# Patient Record
Sex: Female | Born: 1950 | Race: Black or African American | Hispanic: No | Marital: Married | Smoking: Never smoker
Health system: Southern US, Community
[De-identification: ages and names within clinical notes are randomized; demographics above are authoritative.]

## PROBLEM LIST (undated history)

## (undated) DIAGNOSIS — B009 Herpesviral infection, unspecified: Secondary | ICD-10-CM

## (undated) DIAGNOSIS — M1990 Unspecified osteoarthritis, unspecified site: Secondary | ICD-10-CM

## (undated) DIAGNOSIS — I10 Essential (primary) hypertension: Secondary | ICD-10-CM

## (undated) DIAGNOSIS — H269 Unspecified cataract: Secondary | ICD-10-CM

## (undated) DIAGNOSIS — G510 Bell's palsy: Secondary | ICD-10-CM

## (undated) DIAGNOSIS — K219 Gastro-esophageal reflux disease without esophagitis: Secondary | ICD-10-CM

## (undated) HISTORY — DX: Essential (primary) hypertension: I10

## (undated) HISTORY — DX: Bell's palsy: G51.0

## (undated) HISTORY — DX: Herpesviral infection, unspecified: B00.9

## (undated) HISTORY — DX: Unspecified osteoarthritis, unspecified site: M19.90

## (undated) HISTORY — DX: Unspecified cataract: H26.9

## (undated) HISTORY — PX: ABDOMINAL HYSTERECTOMY: SHX81

## (undated) HISTORY — PX: POLYPECTOMY: SHX149

## (undated) HISTORY — DX: Gastro-esophageal reflux disease without esophagitis: K21.9

## (undated) HISTORY — PX: COLONOSCOPY: SHX174

---

## 1997-03-11 ENCOUNTER — Ambulatory Visit (HOSPITAL_COMMUNITY): Admission: RE | Admit: 1997-03-11 | Discharge: 1997-03-11 | Payer: Self-pay | Admitting: Obstetrics and Gynecology

## 1998-05-28 ENCOUNTER — Other Ambulatory Visit: Admission: RE | Admit: 1998-05-28 | Discharge: 1998-05-28 | Payer: Self-pay | Admitting: Obstetrics and Gynecology

## 1999-03-27 ENCOUNTER — Encounter: Admission: RE | Admit: 1999-03-27 | Discharge: 1999-03-27 | Payer: Self-pay | Admitting: Obstetrics and Gynecology

## 1999-03-27 ENCOUNTER — Encounter: Payer: Self-pay | Admitting: Obstetrics and Gynecology

## 1999-10-13 ENCOUNTER — Other Ambulatory Visit: Admission: RE | Admit: 1999-10-13 | Discharge: 1999-10-13 | Payer: Self-pay | Admitting: Obstetrics and Gynecology

## 2000-10-05 ENCOUNTER — Encounter: Payer: Self-pay | Admitting: Internal Medicine

## 2000-10-05 ENCOUNTER — Encounter: Admission: RE | Admit: 2000-10-05 | Discharge: 2000-10-05 | Payer: Self-pay | Admitting: Internal Medicine

## 2000-10-17 ENCOUNTER — Other Ambulatory Visit: Admission: RE | Admit: 2000-10-17 | Discharge: 2000-10-17 | Payer: Self-pay | Admitting: Obstetrics and Gynecology

## 2001-10-11 ENCOUNTER — Encounter: Payer: Self-pay | Admitting: Internal Medicine

## 2001-10-11 ENCOUNTER — Encounter: Admission: RE | Admit: 2001-10-11 | Discharge: 2001-10-11 | Payer: Self-pay | Admitting: Internal Medicine

## 2001-11-27 ENCOUNTER — Other Ambulatory Visit: Admission: RE | Admit: 2001-11-27 | Discharge: 2001-11-27 | Payer: Self-pay | Admitting: Obstetrics and Gynecology

## 2002-06-12 ENCOUNTER — Encounter: Admission: RE | Admit: 2002-06-12 | Discharge: 2002-06-12 | Payer: Self-pay | Admitting: Internal Medicine

## 2002-06-12 ENCOUNTER — Encounter: Payer: Self-pay | Admitting: Internal Medicine

## 2002-10-19 ENCOUNTER — Encounter: Admission: RE | Admit: 2002-10-19 | Discharge: 2002-10-19 | Payer: Self-pay | Admitting: Internal Medicine

## 2002-10-19 ENCOUNTER — Encounter: Payer: Self-pay | Admitting: Internal Medicine

## 2002-12-28 ENCOUNTER — Other Ambulatory Visit: Admission: RE | Admit: 2002-12-28 | Discharge: 2002-12-28 | Payer: Self-pay | Admitting: Obstetrics and Gynecology

## 2004-01-27 ENCOUNTER — Other Ambulatory Visit: Admission: RE | Admit: 2004-01-27 | Discharge: 2004-01-27 | Payer: Self-pay | Admitting: Obstetrics and Gynecology

## 2004-02-06 ENCOUNTER — Ambulatory Visit: Payer: Self-pay | Admitting: Gastroenterology

## 2004-02-07 ENCOUNTER — Ambulatory Visit: Payer: Self-pay | Admitting: Gastroenterology

## 2005-03-03 ENCOUNTER — Other Ambulatory Visit: Admission: RE | Admit: 2005-03-03 | Discharge: 2005-03-03 | Payer: Self-pay | Admitting: Obstetrics and Gynecology

## 2007-06-02 ENCOUNTER — Emergency Department (HOSPITAL_COMMUNITY): Admission: EM | Admit: 2007-06-02 | Discharge: 2007-06-02 | Payer: Self-pay | Admitting: Emergency Medicine

## 2007-06-05 ENCOUNTER — Emergency Department (HOSPITAL_COMMUNITY): Admission: EM | Admit: 2007-06-05 | Discharge: 2007-06-05 | Payer: Self-pay | Admitting: Family Medicine

## 2008-08-22 ENCOUNTER — Encounter: Admission: RE | Admit: 2008-08-22 | Discharge: 2008-08-22 | Payer: Self-pay | Admitting: Internal Medicine

## 2011-02-03 ENCOUNTER — Encounter: Payer: Self-pay | Admitting: Gastroenterology

## 2011-11-29 ENCOUNTER — Emergency Department (HOSPITAL_COMMUNITY): Payer: No Typology Code available for payment source

## 2011-11-29 ENCOUNTER — Encounter (HOSPITAL_COMMUNITY): Payer: Self-pay | Admitting: Emergency Medicine

## 2011-11-29 ENCOUNTER — Emergency Department (HOSPITAL_COMMUNITY)
Admission: EM | Admit: 2011-11-29 | Discharge: 2011-11-29 | Disposition: A | Discharge status: Home / Self Care | Payer: No Typology Code available for payment source | Attending: Emergency Medicine | Admitting: Emergency Medicine

## 2011-11-29 DIAGNOSIS — R0789 Other chest pain: Secondary | ICD-10-CM | POA: Insufficient documentation

## 2011-11-29 DIAGNOSIS — R079 Chest pain, unspecified: Secondary | ICD-10-CM

## 2011-11-29 LAB — URINALYSIS, ROUTINE W REFLEX MICROSCOPIC
Hgb urine dipstick: NEGATIVE
Specific Gravity, Urine: 1.014 (ref 1.005–1.030)
Urobilinogen, UA: 0.2 mg/dL (ref 0.0–1.0)

## 2011-11-29 LAB — LIPASE, BLOOD: Lipase: 19 U/L (ref 11–59)

## 2011-11-29 LAB — COMPREHENSIVE METABOLIC PANEL
ALT: 11 U/L (ref 0–35)
AST: 19 U/L (ref 0–37)
Albumin: 3.8 g/dL (ref 3.5–5.2)
Calcium: 9.5 mg/dL (ref 8.4–10.5)
GFR calc Af Amer: >90 mL/min (ref >90)
Glucose, Bld: 94 mg/dL (ref 70–99)
Sodium: 140 mEq/L (ref 135–145)
Total Protein: 7.5 g/dL (ref 6.0–8.3)

## 2011-11-29 LAB — CBC WITH DIFFERENTIAL/PLATELET
Basophils Absolute: 0 10*3/uL (ref 0.0–0.1)
Basophils Relative: 1 % (ref 0–1)
Eosinophils Absolute: 0.1 10*3/uL (ref 0.0–0.7)
Eosinophils Relative: 1 % (ref 0–5)
MCH: 28.2 pg (ref 26.0–34.0)
MCHC: 33.1 g/dL (ref 30.0–36.0)
MCV: 85.3 fL (ref 78.0–100.0)
Platelets: 187 10*3/uL (ref 150–400)
RDW: 12.3 % (ref 11.5–15.5)

## 2011-11-29 LAB — URINE MICROSCOPIC-ADD ON

## 2011-11-29 MED ORDER — ACETAMINOPHEN 500 MG PO TABS
500.0000 mg | ORAL_TABLET | Freq: Four times a day (QID) | ORAL | Status: DC | PRN
Start: 2011-11-29 — End: 2012-11-20

## 2011-11-29 MED ORDER — KETOROLAC TROMETHAMINE 30 MG/ML IJ SOLN
30.0000 mg | Freq: Once | INTRAMUSCULAR | Status: AC
  Administered 2011-11-29: 30 mg via INTRAVENOUS
  Filled 2011-11-29: qty 1

## 2011-11-29 NOTE — ED Notes (Signed)
Pt c/o mid sternal to left sided CP x 3 days intermittently that is worse with palpation and movement; pt sts some SOB

## 2011-11-29 NOTE — ED Provider Notes (Signed)
History     CSN: [id number]  Arrival date & time 11/29/11  7829   First MD Initiated Contact with Patient 11/29/11 [phone]      Chief Complaint  Patient presents with  . Chest Pain    HPI  The patient presents with ongoing chest pain.  She notes that her symptoms have been present for months, worse over the last days.  The pain is substernal and to left of sternum.  The pain is nonradiating.  There are episodes of spontaneous onset and offset, but over the last days she has described pain that is present with bending over.  The pain is also reproducible with palpation.  She denies significant dyspnea.  She expressly denies fevers, chills, cough, lightheadedness, syncope, unilateral weakness or size changes. She has no prior evaluation for this pain, and denies any cardiac history.  She does have a history of heartburn. The patient has continued to work, and denies smoking or any recent travel.  History reviewed. No pertinent past medical history.  History reviewed. No pertinent past surgical history.  History reviewed. No pertinent family history.  History  Substance Use Topics  . Smoking status: Never Smoker   . Smokeless tobacco: Not on file  . Alcohol Use: No    OB History    Grav Para Term Preterm Abortions TAB SAB Ect Mult Living                  Review of Systems  Constitutional:       Per HPI, otherwise negative  HENT:       Per HPI, otherwise negative  Eyes: Negative.   Respiratory:       Per HPI, otherwise negative  Cardiovascular:       Per HPI, otherwise negative  Gastrointestinal: Negative for vomiting.  Genitourinary: Negative.   Musculoskeletal:       Per HPI, otherwise negative  Skin: Negative.   Neurological: Negative for syncope.    Allergies  Review of patient's allergies indicates no known allergies.  Home Medications  No current outpatient prescriptions on file.  BP 161/73  Pulse 58  Temp 98 F (36.7 C) (Oral)  Resp 18  SpO2  99%  Physical Exam  Nursing note and vitals reviewed. Constitutional: She is oriented to person, place, and time. She appears well-developed and well-nourished. No distress.  HENT:  Head: Normocephalic and atraumatic.  Eyes: Conjunctivae normal and EOM are normal.  Cardiovascular: Normal rate and regular rhythm.   Pulmonary/Chest: Effort normal and breath sounds normal. No stridor. No respiratory distress.    Abdominal: She exhibits no distension.  Musculoskeletal: She exhibits no edema.  Neurological: She is alert and oriented to person, place, and time. No cranial nerve deficit.  Skin: Skin is warm and dry.  Psychiatric: She has a normal mood and affect.    ED Course  Procedures (including critical care time)   Labs Reviewed  CBC WITH DIFFERENTIAL  COMPREHENSIVE METABOLIC PANEL  TROPONIN I  URINALYSIS, ROUTINE W REFLEX MICROSCOPIC  LIPASE, BLOOD   No results found.   No diagnosis found.  Cardiac: 55sr, borde O2-99%ra, normal   Date: 11/29/2011  Rate: 54  Rhythm: sinus bradycardia  QRS Axis: normal  Intervals: normal  ST/T Wave abnormalities: normal  Conduction Disutrbances:none  Narrative Interpretation:   Old EKG Reviewed: none available BORDERLINE   MDM  The patient presents with months of chest pain, reproducible on exam.  The patient is in no distress with unremarkable vital signs.  Given the patient's description of symptoms for months, the positional exacerbation, the absence of distress, her unremarkable labs and EKG, she was discharged in stable condition after discussion on the need for analgesics, PMD followup.        Gerhard Munch, MD 11/29/11 1244

## 2011-11-29 NOTE — ED Notes (Signed)
Pt reports CP for 3-4 months intermittently.  States that pain is worse with palpation.  Denies N/V/diaphoresis.  States that she was leaned over getting clothes out of the wash when the pain started today.  No distress noted.

## 2012-08-01 ENCOUNTER — Emergency Department (INDEPENDENT_AMBULATORY_CARE_PROVIDER_SITE_OTHER)
Admission: EM | Admit: 2012-08-01 | Discharge: 2012-08-01 | Disposition: A | Discharge status: Home / Self Care | Payer: PRIVATE HEALTH INSURANCE | Source: Home / Self Care | Attending: Emergency Medicine | Admitting: Emergency Medicine

## 2012-08-01 ENCOUNTER — Encounter (HOSPITAL_COMMUNITY): Payer: Self-pay | Admitting: Emergency Medicine

## 2012-08-01 DIAGNOSIS — J019 Acute sinusitis, unspecified: Secondary | ICD-10-CM

## 2012-08-01 DIAGNOSIS — J209 Acute bronchitis, unspecified: Secondary | ICD-10-CM

## 2012-08-01 MED ORDER — BENZONATATE 200 MG PO CAPS: 200.0000 mg | ORAL_CAPSULE | Freq: Three times a day (TID) | ORAL | Status: DC | PRN

## 2012-08-01 MED ORDER — PREDNISONE 20 MG PO TABS: 20.0000 mg | ORAL_TABLET | Freq: Two times a day (BID) | ORAL | Status: DC

## 2012-08-01 MED ORDER — AMOXICILLIN 500 MG PO CAPS: 1000.0000 mg | ORAL_CAPSULE | Freq: Three times a day (TID) | ORAL | Status: DC

## 2012-08-01 MED ORDER — ALBUTEROL SULFATE HFA 108 (90 BASE) MCG/ACT IN AERS: 1.0000 | INHALATION_SPRAY | Freq: Four times a day (QID) | RESPIRATORY_TRACT | Status: DC | PRN

## 2012-08-01 NOTE — ED Notes (Signed)
Reports yellow phlegm, cough, unknown fever, reports sweats, sore throat early on in illness.  Patient has pain with cough in her chest and pain in throat with cough.

## 2012-08-01 NOTE — ED Provider Notes (Signed)
Chief Complaint:   Chief Complaint  Patient presents with  . URI    History of Present Illness:   Rachel [name] is a 62 year old female who's had a two-week history of upper respiratory symptoms with cough productive yellow sputum, aching in her chest when she coughs, sweats, nasal congestion yellow rhinorrhea, sore throat, hoarseness, and loose stools. She denies any fever, chills, wheezing, headache, sinus pressure, or ear pain or pressure. She's had no nausea, vomiting, or abdominal pain.  Review of Systems:  Other than noted above, the patient denies any of the following symptoms: Systemic:  No fevers, chills, sweats, weight loss or gain, fatigue, or tiredness. Eye:  No redness or discharge. ENT:  No ear pain, drainage, headache, nasal congestion, drainage, sinus pressure, difficulty swallowing, or sore throat. Neck:  No neck pain or swollen glands. Lungs:  No cough, sputum production, hemoptysis, wheezing, chest tightness, shortness of breath or chest pain. GI:  No abdominal pain, nausea, vomiting or diarrhea.  PMFSH:  Past medical history, family history, social history, meds, and allergies were reviewed. She takes aspirin estrogens.  Physical Exam:   Vital signs:  BP 155/79  Pulse 67  Temp(Src) 98.2 F (36.8 C) (Oral)  Resp 20  SpO2 100%  LMP 08/01/2012 General:  Alert and oriented.  In no distress.  Skin warm and dry. Eye:  No conjunctival injection or drainage. Lids were normal. ENT:  TMs and canals were normal, without erythema or inflammation.  Nasal mucosa was clear and uncongested, without drainage.  Mucous membranes were moist.  Pharynx was clear with no exudate or drainage.  There were no oral ulcerations or lesions. Neck:  Supple, no adenopathy, tenderness or mass. Lungs:  No respiratory distress.  Lungs were clear to auscultation, without wheezes, rales or rhonchi.  Breath sounds were clear and equal bilaterally.  Heart:  Regular rhythm, without gallops, murmers  or rubs. Skin:  Clear, warm, and dry, without rash or lesions.  Assessment:  The primary encounter diagnosis was Acute bronchitis. A diagnosis of Acute sinusitis was also pertinent to this visit.  Plan:   1.  The following meds were prescribed:   Discharge Medication List as of 08/01/2012  9:50 AM    START taking these medications   Details  albuterol (PROVENTIL HFA;VENTOLIN HFA) 108 (90 BASE) MCG/ACT inhaler Inhale 1-2 puffs into the lungs every 6 (six) hours as needed for wheezing., Starting 08/01/2012, Until Discontinued, Normal    amoxicillin (AMOXIL) 500 MG capsule Take 2 capsules (1,000 mg total) by mouth 3 (three) times daily., Starting 08/01/2012, Until Discontinued, Normal    benzonatate (TESSALON) 200 MG capsule Take 1 capsule (200 mg total) by mouth 3 (three) times daily as needed for cough., Starting 08/01/2012, Until Discontinued, Normal    predniSONE (DELTASONE) 20 MG tablet Take 1 tablet (20 mg total) by mouth 2 (two) times daily., Starting 08/01/2012, Until Discontinued, Normal       2.  The patient was instructed in symptomatic care and handouts were given. 3.  The patient was told to return if becoming worse in any way, if no better in 3 or 4 days, and given some red flag symptoms such as worsening pain or difficulty breathing that would indicate earlier return. 4.  Follow up here if necessary.      Reuben Likes, MD 08/01/12 [phone]

## 2012-10-31 ENCOUNTER — Other Ambulatory Visit: Payer: Self-pay | Admitting: Obstetrics and Gynecology

## 2012-10-31 DIAGNOSIS — R928 Other abnormal and inconclusive findings on diagnostic imaging of breast: Secondary | ICD-10-CM

## 2012-11-13 ENCOUNTER — Other Ambulatory Visit: Payer: PRIVATE HEALTH INSURANCE

## 2012-11-20 ENCOUNTER — Emergency Department (HOSPITAL_COMMUNITY)
Admission: EM | Admit: 2012-11-20 | Discharge: 2012-11-20 | Disposition: A | Discharge status: Home / Self Care | Payer: PRIVATE HEALTH INSURANCE | Attending: Emergency Medicine | Admitting: Emergency Medicine

## 2012-11-20 ENCOUNTER — Emergency Department (HOSPITAL_COMMUNITY): Payer: PRIVATE HEALTH INSURANCE

## 2012-11-20 ENCOUNTER — Encounter (HOSPITAL_COMMUNITY): Payer: Self-pay | Admitting: Emergency Medicine

## 2012-11-20 DIAGNOSIS — Z79899 Other long term (current) drug therapy: Secondary | ICD-10-CM | POA: Insufficient documentation

## 2012-11-20 DIAGNOSIS — R071 Chest pain on breathing: Secondary | ICD-10-CM | POA: Insufficient documentation

## 2012-11-20 DIAGNOSIS — R079 Chest pain, unspecified: Secondary | ICD-10-CM

## 2012-11-20 DIAGNOSIS — M25519 Pain in unspecified shoulder: Secondary | ICD-10-CM | POA: Insufficient documentation

## 2012-11-20 LAB — CBC
MCH: 28.9 pg (ref 26.0–34.0)
MCV: 85.5 fL (ref 78.0–100.0)
Platelets: 182 10*3/uL (ref 150–400)
RDW: 12.6 % (ref 11.5–15.5)
WBC: 6.6 10*3/uL (ref 4.0–10.5)

## 2012-11-20 LAB — COMPREHENSIVE METABOLIC PANEL
AST: 19 U/L (ref 0–37)
Albumin: 4.2 g/dL (ref 3.5–5.2)
Calcium: 9.6 mg/dL (ref 8.4–10.5)
Chloride: 104 mEq/L (ref 96–112)
Creatinine, Ser: 0.59 mg/dL (ref 0.50–1.10)
Total Protein: 7.9 g/dL (ref 6.0–8.3)

## 2012-11-20 LAB — POCT I-STAT TROPONIN I

## 2012-11-20 MED ORDER — IBUPROFEN 600 MG PO TABS: 600.0000 mg | ORAL_TABLET | Freq: Three times a day (TID) | ORAL | Status: DC | PRN

## 2012-11-20 MED ORDER — IBUPROFEN 400 MG PO TABS
600.0000 mg | ORAL_TABLET | Freq: Once | ORAL | Status: AC
  Administered 2012-11-20: 600 mg via ORAL
  Filled 2012-11-20 (×2): qty 1

## 2012-11-20 MED ORDER — OXYCODONE-ACETAMINOPHEN 5-325 MG PO TABS
1.0000 | ORAL_TABLET | Freq: Once | ORAL | Status: AC
  Administered 2012-11-20: 1 via ORAL
  Filled 2012-11-20: qty 1

## 2012-11-20 NOTE — ED Provider Notes (Signed)
CSN: [id number]     Arrival date & time 11/20/12  1134 History   First MD Initiated Contact with Patient 11/20/12 1145     Chief Complaint  Patient presents with  . Chest Pain    The history is provided by the patient.   patient reports left-sided chest pain that's been rather continuous over the past several weeks but at times seems to worsen.  Occasionally it seems to take her breath away.  Her pain is worsened by palpation of her left chest and movement of her left arm.  She has no prior history of cardiac disease.  No history hypertension, hyperlipidemia, diabetes.  She does not smoke cigarettes.  She was seen by her primary care physician today who noted nonspecific ST changes and thus sent her to the ER for further evaluation.  She has had some occasional right trapezius and right scapular pain.  Is not always related to her anterior chest pain.  No shortness of breath at this time.  No cough or congestion.  No fevers or chills.  No unilateral leg swelling.  No history of DVT or pulmonary embolism.  History reviewed. No pertinent past medical history. Past Surgical History  Procedure Laterality Date  . Abdominal hysterectomy     No family history on file. History  Substance Use Topics  . Smoking status: Never Smoker   . Smokeless tobacco: Not on file  . Alcohol Use: No   OB History   Grav Para Term Preterm Abortions TAB SAB Ect Mult Living                 Review of Systems  Cardiovascular: Positive for chest pain.  All other systems reviewed and are negative.    Allergies  Review of patient's allergies indicates no known allergies.  Home Medications   Current Outpatient Rx  Name  Route  Sig  Dispense  Refill  . acyclovir (ZOVIRAX) 400 MG tablet   Oral   Take 800 mg by mouth daily.         Marland Kitchen estrogens, conjugated, (PREMARIN) 0.3 MG tablet   Oral   Take 0.3 mg by mouth daily.         . Multiple Vitamin (MULTIVITAMIN WITH MINERALS) TABS tablet   Oral   Take 1  tablet by mouth daily.         Marland Kitchen omeprazole (PRILOSEC) 20 MG capsule   Oral   Take 20 mg by mouth daily.         Marland Kitchen ibuprofen (ADVIL,MOTRIN) 600 MG tablet   Oral   Take 1 tablet (600 mg total) by mouth every 8 (eight) hours as needed for pain.   15 tablet   0    BP 153/70  Pulse 48  Temp(Src) 97.6 F (36.4 C)  Resp 18  Wt 152 lb (68.947 kg)  SpO2 100%  LMP 08/01/2012 Physical Exam  Nursing note and vitals reviewed. Constitutional: She is oriented to person, place, and time. She appears well-developed and well-nourished. No distress.  HENT:  Head: Normocephalic and atraumatic.  Eyes: EOM are normal.  Neck: Normal range of motion.  Cardiovascular: Normal rate, regular rhythm and normal heart sounds.   Pulmonary/Chest: Effort normal and breath sounds normal.  Tenderness of left anterior chest wall at the left costochondral junction  Abdominal: Soft. She exhibits no distension. There is no tenderness.  Musculoskeletal: Normal range of motion.  Neurological: She is alert and oriented to person, place, and time.  Skin: Skin  is warm and dry.  Psychiatric: She has a normal mood and affect. Judgment normal.    ED Course  Procedures (including critical care time) Labs Review Labs Reviewed  CBC  COMPREHENSIVE METABOLIC PANEL  POCT I-STAT TROPONIN I   Imaging Review Dg Chest 2 View  11/20/2012   CLINICAL DATA:  Left-sided chest pain.  EXAM: CHEST  2 VIEW  COMPARISON:  11/29/2011.  FINDINGS: The heart size and mediastinal contours are within normal limits. Both lungs are clear. The visualized skeletal structures are unremarkable.  IMPRESSION: No active cardiopulmonary disease.   Electronically Signed   By: Loralie Champagne M.D.   On: 11/20/2012 13:08  I personally reviewed the imaging tests through PACS system I reviewed available ER/hospitalization records through the EMR   EKG Interpretation     Ventricular Rate:  52 PR Interval:  160 QRS Duration: 78 QT  Interval:  438 QTC Calculation: 407 R Axis:   19 Text Interpretation:  Sinus bradycardia Nonspecific ST abnormality Abnormal ECG            MDM   1. Chest pain    My suspicion for ACS is low.  My suspicion for pulmonary embolism is very low.  EKG without significant changes.  Her chest is very atypical I do not believe this is a cardiac picture.  It is reproducible and worse with movement of her left arm.  His been intermittent over the past several weeks but constant over the past 12-14 hours.  Initial troponin is negative.  Worse with palpation of her left chest.  Discharge home with anti-inflammatories.  Outpatient cardiology followup.  PCP referral    Lyanne Co, MD 11/20/12 [phone]

## 2012-11-20 NOTE — ED Notes (Addendum)
Cp that started last night went to dr this am and had ekg changes was sent for further tests gets tired quick she states hurts to take a deep breath

## 2012-11-30 ENCOUNTER — Ambulatory Visit (INDEPENDENT_AMBULATORY_CARE_PROVIDER_SITE_OTHER): Payer: PRIVATE HEALTH INSURANCE | Admitting: Cardiology

## 2012-11-30 ENCOUNTER — Encounter: Payer: Self-pay | Admitting: Cardiology

## 2012-11-30 VITALS — BP 144/70 | HR 58 | Ht 62.0 in | Wt 146.0 lb

## 2012-11-30 DIAGNOSIS — R079 Chest pain, unspecified: Secondary | ICD-10-CM

## 2012-11-30 DIAGNOSIS — I10 Essential (primary) hypertension: Secondary | ICD-10-CM

## 2012-11-30 DIAGNOSIS — R0789 Other chest pain: Secondary | ICD-10-CM | POA: Insufficient documentation

## 2012-11-30 NOTE — Patient Instructions (Signed)
Your physician has requested that you have an exercise tolerance test. Please also follow instruction sheet, as given.  Your physician recommends that you schedule a follow-up appointment in: WITH DR. Albertha Ghee AFTER TO DISCUSS RESULTS  Your physician recommends that you return for lab work in: LIPIDS THE SAME DAY AS TREADMILL TEST  Your physician recommends that you continue on your current medications as directed. Please refer to the Current Medication list given to you today.

## 2012-11-30 NOTE — Progress Notes (Signed)
Patient ID: UNDINE NEALIS, female   DOB: 04/26/1950, 62 y.o.   MRN: [id number]     Patient Name: Rachel [name] Date of Encounter: 11/30/2012  Primary Care Provider:  Enrique Sack, MD Primary Cardiologist:  Tobias Alexander, H  Problem List   No past medical history on file. Past Surgical History  Procedure Laterality Date  . Abdominal hysterectomy      Allergies  No Known Allergies  HPI  62 year old female who was recently seen in the hospital for retrosternal pressure like chest pain  that's been rather continuous over the past several weeks but at times seems to worsen. Occasionally it seems to take her breath away. Sometimes it feels that it is positional. She works manually and carries heavy stuff. The patient also feels lately she has been short of breath on exertion. No obvious exacerbating or alleviating factors. She has no prior history of cardiac disease. No history hypertension, hyperlipidemia, diabetes. She does not smoke cigarettes.  She doesn't remember any viral-like disease prior to start of this pain.  No cough or congestion. No fevers or chills. No history of DVT or pulmonary embolism. Her brother died of MI at age 76.  Home Medications  Prior to Admission medications   Medication Sig Start Date End Date Taking? Authorizing Provider  acyclovir (ZOVIRAX) 400 MG tablet Take 800 mg by mouth daily.   Yes Historical Provider, MD  estrogens, conjugated, (PREMARIN) 0.3 MG tablet Take 0.3 mg by mouth daily.   Yes Historical Provider, MD  ibuprofen (ADVIL,MOTRIN) 600 MG tablet Take 1 tablet (600 mg total) by mouth every 8 (eight) hours as needed for pain. 11/20/12  Yes Lyanne Co, MD  Multiple Vitamin (MULTIVITAMIN WITH MINERALS) TABS tablet Take 1 tablet by mouth daily.   Yes Historical Provider, MD  omeprazole (PRILOSEC) 20 MG capsule Take 20 mg by mouth daily.   Yes Historical Provider, MD    Family History  No family history on file.  Social  History  History   Social History  . Marital Status: Married    Spouse Name: N/A    Number of Children: N/A  . Years of Education: N/A   Occupational History  . Not on file.   Social History Main Topics  . Smoking status: Never Smoker   . Smokeless tobacco: Not on file  . Alcohol Use: No  . Drug Use: No  . Sexual Activity: Not on file   Other Topics Concern  . Not on file   Social History Narrative  . No narrative on file     Review of Systems, as per HPI, otherwise negative General:  No chills, fever, night sweats or weight changes.  Cardiovascular:  No chest pain, dyspnea on exertion, edema, orthopnea, palpitations, paroxysmal nocturnal dyspnea. Dermatological: No rash, lesions/masses Respiratory: No cough, dyspnea Urologic: No hematuria, dysuria Abdominal:   No nausea, vomiting, diarrhea, bright red blood per rectum, melena, or hematemesis Neurologic:  No visual changes, wkns, changes in mental status. All other systems reviewed and are otherwise negative except as noted above.  Physical Exam  Blood pressure 144/70, pulse 58, height 5\' 2"  (1.575 m), weight 146 lb (66.225 kg), last menstrual period 08/01/2012.  General: Pleasant, NAD Psych: Normal affect. Neuro: Alert and oriented X 3. Moves all extremities spontaneously. HEENT: Normal  Neck: Supple without bruits or JVD. Lungs:  Resp regular and unlabored, CTA. Heart: RRR no s3, s4, or murmurs. Abdomen: Soft, non-tender, non-distended, BS + x 4.  Extremities: No clubbing, cyanosis or edema. DP/PT/Radials 2+ and equal bilaterally.  Labs:  No results found for this basename: CKTOTAL, CKMB, TROPONINI,  in the last 72 hours Lab Results  Component Value Date   WBC 6.6 11/20/2012   HGB 14.0 11/20/2012   HCT 41.4 11/20/2012   MCV 85.5 11/20/2012   PLT 182 11/20/2012   No results found for this basename: NA, K, CL, CO2, BUN, CREATININE, CALCIUM, LABALBU, PROT, BILITOT, ALKPHOS, ALT, AST, GLUCOSE,  in the last 168  hours No results found for this basename: CHOL, HDL, LDLCALC, TRIG   No results found for this basename: DDIMER   No components found with this basename: POCBNP,   Accessory Clinical Findings  echocardiogram  ECG - SB, 52 BPM, nonspecific ST -T wave abnormalities   Assessment & Plan  1. Atypical chest pain, DOE - the differential is broad and includes CAD, pericarditis and musculoskeletal pain. We will start her on Ibuprofen 400 mg PO BID and perform an exercise stress test without imaging.  2. Hypertension - repeated was 130/85  3. Lipids - last checked a year ago at her PCPs office, she doesn't know the results, we will order them now  Follow up in 1 months   Tobias Alexander, Rexene Edison, MD, [facility] 11/30/2012, 3:12 PM

## 2012-12-05 ENCOUNTER — Other Ambulatory Visit: Payer: Self-pay

## 2012-12-05 ENCOUNTER — Telehealth: Payer: Self-pay | Admitting: *Deleted

## 2012-12-05 DIAGNOSIS — R0789 Other chest pain: Secondary | ICD-10-CM

## 2012-12-05 NOTE — Telephone Encounter (Signed)
Pt do not have a VM. Calling to inform hpt that her treadmill test date and location has been changed to December 2 at 3:30pm at the [facility] location due to wrong test being ordered and not having any available morning times at [address] location. Spoke to pt husband about this and he stated if pt could call back on 12-06-2012. I informed him that I was off that day but will leave a message in the system for someone else to inform her. Husband agreed to pass the message to pt.  Pt Labs has also been rescheduleed the morning of her treadmill 12-19-2012 for the [facility] location. Dr. Delton See nurse is aware of the change ups. Also Pt Office Visit with Dr. Delton See has been rescheduled for December 25, 2012 at 9:30 am. I will call pt back on 12-07-2012 if no one has contacted her

## 2012-12-06 ENCOUNTER — Ambulatory Visit
Admission: RE | Admit: 2012-12-06 | Discharge: 2012-12-06 | Disposition: A | Discharge status: Home / Self Care | Payer: PRIVATE HEALTH INSURANCE | Source: Ambulatory Visit | Attending: Obstetrics and Gynecology | Admitting: Obstetrics and Gynecology

## 2012-12-06 DIAGNOSIS — R928 Other abnormal and inconclusive findings on diagnostic imaging of breast: Secondary | ICD-10-CM

## 2012-12-07 ENCOUNTER — Encounter (HOSPITAL_COMMUNITY): Payer: PRIVATE HEALTH INSURANCE

## 2012-12-07 ENCOUNTER — Telehealth: Payer: Self-pay | Admitting: *Deleted

## 2012-12-07 ENCOUNTER — Other Ambulatory Visit: Payer: PRIVATE HEALTH INSURANCE

## 2012-12-07 NOTE — Telephone Encounter (Signed)
Spoke to pt about the change in her schedule and she stated that she may have to reschedule all of her appointments but she will call office back before then to make her decision. Pt stated she is no longer hurting anymore so she do not know yet if she will keep appointment. Informed pt that if she cancelled her treadmill she would have to change all her other appointments but we will leave the appointments as scheduled until she calls office. Pt agreed.

## 2012-12-12 ENCOUNTER — Encounter (HOSPITAL_COMMUNITY): Payer: PRIVATE HEALTH INSURANCE

## 2012-12-18 ENCOUNTER — Ambulatory Visit: Payer: PRIVATE HEALTH INSURANCE | Admitting: Cardiology

## 2012-12-19 ENCOUNTER — Other Ambulatory Visit: Payer: PRIVATE HEALTH INSURANCE

## 2012-12-19 ENCOUNTER — Encounter (HOSPITAL_COMMUNITY): Payer: PRIVATE HEALTH INSURANCE

## 2012-12-20 ENCOUNTER — Ambulatory Visit: Payer: PRIVATE HEALTH INSURANCE | Admitting: Cardiology

## 2012-12-25 ENCOUNTER — Ambulatory Visit: Payer: PRIVATE HEALTH INSURANCE | Admitting: Cardiology

## 2013-01-16 ENCOUNTER — Encounter: Payer: Self-pay | Admitting: Cardiology

## 2013-07-06 ENCOUNTER — Other Ambulatory Visit: Payer: Self-pay | Admitting: Obstetrics and Gynecology

## 2013-07-06 DIAGNOSIS — N6452 Nipple discharge: Secondary | ICD-10-CM

## 2013-07-13 ENCOUNTER — Ambulatory Visit
Admission: RE | Admit: 2013-07-13 | Discharge: 2013-07-13 | Disposition: A | Discharge status: Home / Self Care | Payer: PRIVATE HEALTH INSURANCE | Source: Ambulatory Visit | Attending: Obstetrics and Gynecology | Admitting: Obstetrics and Gynecology

## 2013-07-13 ENCOUNTER — Ambulatory Visit: Admission: RE | Admit: 2013-07-13 | Payer: PRIVATE HEALTH INSURANCE | Source: Ambulatory Visit

## 2013-07-13 ENCOUNTER — Encounter (INDEPENDENT_AMBULATORY_CARE_PROVIDER_SITE_OTHER): Payer: Self-pay

## 2013-07-13 DIAGNOSIS — N6452 Nipple discharge: Secondary | ICD-10-CM

## 2013-11-19 ENCOUNTER — Encounter: Payer: Self-pay | Admitting: Gastroenterology

## 2013-12-15 ENCOUNTER — Ambulatory Visit (INDEPENDENT_AMBULATORY_CARE_PROVIDER_SITE_OTHER): Payer: Managed Care, Other (non HMO)

## 2013-12-15 ENCOUNTER — Ambulatory Visit (INDEPENDENT_AMBULATORY_CARE_PROVIDER_SITE_OTHER): Payer: Managed Care, Other (non HMO) | Admitting: Family Medicine

## 2013-12-15 VITALS — BP 122/80 | HR 60 | Temp 98.0°F | Resp 18 | Ht 61.0 in | Wt 148.8 lb

## 2013-12-15 DIAGNOSIS — M25562 Pain in left knee: Secondary | ICD-10-CM

## 2013-12-15 DIAGNOSIS — T148XXA Other injury of unspecified body region, initial encounter: Secondary | ICD-10-CM

## 2013-12-15 DIAGNOSIS — T148 Other injury of unspecified body region: Secondary | ICD-10-CM

## 2013-12-15 MED ORDER — CYCLOBENZAPRINE HCL 5 MG PO TABS: 5.0000 mg | ORAL_TABLET | Freq: Every evening | ORAL | Status: DC | PRN

## 2013-12-15 NOTE — Patient Instructions (Signed)
Tylenol 500 mg every 8 hours as needed for pain Ibuprofen 600 mg every 6-8 hours as needed for pain , take with food, no other NSAIDs.

## 2013-12-15 NOTE — Progress Notes (Signed)
Chief Complaint:  Chief Complaint  Patient presents with  . Knee Pain    swollen, x 2 weeks, left knee, pt thinks she pulled something    HPI: Rachel [name] is a 63 y.o. female who is here for  2 week hx of left knee pain, was walkig down stairs if her door to feed her dog She felt something pop , it hurts when she squats, again she She hears a pop and click when she goes up and down stairs. And has posterior knee pain She has pain when she walks, she works nights, she is a Glass blower/designer, she does a lot of walking. She has had some swelling Icy hot bengay, has not tried any oral medicines. She does not like  Taking meds if at all possible. Denies numbness weakness tingling Deneis any osteoporosis or osteopenia. No feleling of instability. She has had some swelling.   History reviewed. No pertinent past medical history. Past Surgical History  Procedure Laterality Date  . Abdominal hysterectomy     History   Social History  . Marital Status: Married    Spouse Name: N/A    Number of Children: N/A  . Years of Education: N/A   Social History Main Topics  . Smoking status: Never Smoker   . Smokeless tobacco: Never Used  . Alcohol Use: No  . Drug Use: No  . Sexual Activity: None   Other Topics Concern  . None   Social History Narrative   Family History  Problem Relation Age of Onset  . Cancer Mother   . Stroke Mother   . Heart disease Father    No Known Allergies Prior to Admission medications   Medication Sig Start Date End Date Taking? Authorizing Provider  acyclovir (ZOVIRAX) 400 MG tablet Take 800 mg by mouth daily.   Yes Historical Provider, MD  estrogens, conjugated, (PREMARIN) 0.3 MG tablet Take 0.3 mg by mouth daily.   Yes Historical Provider, MD  ibuprofen (ADVIL,MOTRIN) 600 MG tablet Take 1 tablet (600 mg total) by mouth every 8 (eight) hours as needed for pain. 11/20/12  Yes Hoy Morn, MD  Multiple Vitamin (MULTIVITAMIN WITH MINERALS) TABS  tablet Take 1 tablet by mouth daily.   Yes Historical Provider, MD  omeprazole (PRILOSEC) 20 MG capsule Take 20 mg by mouth daily.   Yes Historical Provider, MD     ROS: The patient denies fevers, chills, night sweats, unintentional weight loss, chest pain, palpitations, wheezing, dyspnea on exertion, nausea, vomiting, abdominal pain, dysuria, hematuria, melena, numbness, weakness, or tingling.  All other systems have been reviewed and were otherwise negative with the exception of those mentioned in the HPI and as above.    PHYSICAL EXAM: Filed Vitals:   12/15/13 1318  BP: 122/80  Pulse: 60  Temp: 98 F (36.7 C)  Resp: 18   Filed Vitals:   12/15/13 1318  Height: 5\' 1"  (1.549 m)  Weight: 148 lb 12.8 oz (67.495 kg)   Body mass index is 28.13 kg/(m^2).  General: Alert, no acute distress HEENT:  Normocephalic, atraumatic, oropharynx patent. EOMI, PERRLA Cardiovascular:  Regular rate and rhythm, no rubs murmurs or gallops.  No Carotid bruits, radial pulse intact. No pedal edema.  Respiratory: Clear to auscultation bilaterally.  No wheezes, rales, or rhonchi.  No cyanosis, no use of accessory musculature GI: No organomegaly, abdomen is soft and non-tender, positive bowel sounds.  No masses. Skin: No rashes. Neurologic: Facial musculature symmetric. Psychiatric: Patient  is appropriate throughout our interaction. Lymphatic: No cervical lymphadenopathy Musculoskeletal: Gait antalgic KNEE + mild swelling Neg ecchymosis or deformities + medial and lateral knee tenderness , she has +/- jt line tenderness,there is a slight movement of a what feels like ? Cartilage  Full ROM, posterior tenderness , no erythema, minimal warmth 5/5 strength, 2/2 DTRs No saddle anesthesia  Straight leg negative Hip exam nomral Back exam normal    LABS: Results for orders placed or performed during the hospital encounter of 11/20/12  CBC  Result Value Ref Range   WBC 6.6 4.0 - 10.5 K/uL   RBC 4.84  3.87 - 5.11 MIL/uL   Hemoglobin 14.0 12.0 - 15.0 g/dL   HCT 41.4 36.0 - 46.0 %   MCV 85.5 78.0 - 100.0 fL   MCH 28.9 26.0 - 34.0 pg   MCHC 33.8 30.0 - 36.0 g/dL   RDW 12.6 11.5 - 15.5 %   Platelets 182 150 - 400 K/uL  Comprehensive metabolic panel  Result Value Ref Range   Sodium 139 135 - 145 mEq/L   Potassium 3.5 3.5 - 5.1 mEq/L   Chloride 104 96 - 112 mEq/L   CO2 26 19 - 32 mEq/L   Glucose, Bld 90 70 - 99 mg/dL   BUN 12 6 - 23 mg/dL   Creatinine, Ser 0.59 0.50 - 1.10 mg/dL   Calcium 9.6 8.4 - 10.5 mg/dL   Total Protein 7.9 6.0 - 8.3 g/dL   Albumin 4.2 3.5 - 5.2 g/dL   AST 19 0 - 37 U/L   ALT 12 0 - 35 U/L   Alkaline Phosphatase 63 39 - 117 U/L   Total Bilirubin 0.7 0.3 - 1.2 mg/dL   GFR calc non Af Amer >90 >90 mL/min   GFR calc Af Amer >90 >90 mL/min  POCT i-Stat troponin I  Result Value Ref Range   Troponin i, poc 0.00 0.00 - 0.08 ng/mL   Comment 3             EKG/XRAY:   Primary read interpreted by Dr. Marin Comment at [facility]. Neg fx or dislocation   ASSESSMENT/PLAN: Encounter Diagnoses  Name Primary?  . Knee pain, acute, left Yes  . Sprain and strain    RICE Knee brace given She will take ibuprofen prn for pain Decline stronger pain meds F/u prn   Gross sideeffects, risk and benefits, and alternatives of medications d/w patient. Patient is aware that all medications have potential sideeffects and we are unable to predict every sideeffect or drug-drug interaction that may occur.  LE, [place], DO 12/15/2013 2:52 PM

## 2013-12-18 ENCOUNTER — Encounter: Payer: Self-pay | Admitting: Family Medicine

## 2013-12-24 ENCOUNTER — Telehealth: Payer: Self-pay

## 2013-12-24 NOTE — Telephone Encounter (Signed)
Please advise on work restrictions.

## 2013-12-24 NOTE — Telephone Encounter (Signed)
Can you call her and tell her the work note is ready for her to pick up . Also can you print it and sign it for me. She probably needs a re-evaluation. If she is not improved in the next 2 weeks we need to see her again or see ortho. Thanks Dr Marin Comment. It should be under letters.

## 2013-12-24 NOTE — Telephone Encounter (Signed)
Patient needs doctor  Note from Dr. Marin Comment excusing/ work restrictions for her knee. Patient is still in a lot of pain and has been seeing Dr. Marin Comment about her knee. Patient says this is not workers comp. 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 Please advise. Patient would like this note this morning today if possible. I told Dr. Marin Comment is seeing patients she may need more notice.   Best: [phone] And other number is: [phone]

## 2013-12-25 NOTE — Telephone Encounter (Signed)
Pt notified. Letter printed and in pick up drawer.

## 2014-01-04 ENCOUNTER — Ambulatory Visit (AMBULATORY_SURGERY_CENTER): Payer: Managed Care, Other (non HMO) | Admitting: *Deleted

## 2014-01-04 VITALS — Ht 61.5 in | Wt 146.0 lb

## 2014-01-04 DIAGNOSIS — Z1211 Encounter for screening for malignant neoplasm of colon: Secondary | ICD-10-CM

## 2014-01-04 MED ORDER — NA SULFATE-K SULFATE-MG SULF 17.5-3.13-1.6 GM/177ML PO SOLN: ORAL | Status: DC

## 2014-01-04 NOTE — Progress Notes (Signed)
No egg or soy allergy  No anesthesia or intubation problems per pt  No diet medications taken  Registered in EMMI   

## 2014-01-09 ENCOUNTER — Telehealth: Payer: Self-pay | Admitting: Gastroenterology

## 2014-01-09 NOTE — Telephone Encounter (Signed)
Patient will pick up sample Suprep kit tomorrow

## 2014-01-22 ENCOUNTER — Encounter: Payer: PRIVATE HEALTH INSURANCE | Admitting: Gastroenterology

## 2014-01-25 ENCOUNTER — Encounter: Payer: Self-pay | Admitting: Gastroenterology

## 2014-01-25 ENCOUNTER — Ambulatory Visit (AMBULATORY_SURGERY_CENTER): Payer: PRIVATE HEALTH INSURANCE | Admitting: Gastroenterology

## 2014-01-25 VITALS — BP 150/74 | HR 52 | Temp 97.2°F | Resp 16 | Ht 61.5 in | Wt 146.0 lb

## 2014-01-25 DIAGNOSIS — D124 Benign neoplasm of descending colon: Secondary | ICD-10-CM | POA: Diagnosis not present

## 2014-01-25 DIAGNOSIS — Z1211 Encounter for screening for malignant neoplasm of colon: Secondary | ICD-10-CM

## 2014-01-25 MED ORDER — SODIUM CHLORIDE 0.9 % IV SOLN: 500.0000 mL | INTRAVENOUS | Status: DC

## 2014-01-25 NOTE — Progress Notes (Signed)
Called to room to assist during endoscopic procedure.  Patient ID and intended procedure confirmed with present staff. Received instructions for my participation in the procedure from the performing physician.  

## 2014-01-25 NOTE — Patient Instructions (Signed)
YOU HAD AN ENDOSCOPIC PROCEDURE TODAY AT THE [facility]: Refer to the procedure report that was given to you for any specific questions about what was found during the examination.  If the procedure report does not answer your questions, please call your gastroenterologist to clarify.  If you requested that your care partner not be given the details of your procedure findings, then the procedure report has been included in a sealed envelope for you to review at your convenience later.  YOU SHOULD EXPECT: Some feelings of bloating in the abdomen. Passage of more gas than usual.  Walking can help get rid of the air that was put into your GI tract during the procedure and reduce the bloating. If you had a lower endoscopy (such as a colonoscopy or flexible sigmoidoscopy) you may notice spotting of blood in your stool or on the toilet paper. If you underwent a bowel prep for your procedure, then you may not have a normal bowel movement for a few days.  DIET: Your first meal following the procedure should be a light meal and then it is ok to progress to your normal diet.  A half-sandwich or bowl of soup is an example of a good first meal.  Heavy or fried foods are harder to digest and may make you feel nauseous or bloated.  Likewise meals heavy in dairy and vegetables can cause extra gas to form and this can also increase the bloating.  Drink plenty of fluids but you should avoid alcoholic beverages for 24 hours.  ACTIVITY: Your care partner should take you home directly after the procedure.  You should plan to take it easy, moving slowly for the rest of the day.  You can resume normal activity the day after the procedure however you should NOT DRIVE or use heavy machinery for 24 hours (because of the sedation medicines used during the test).    SYMPTOMS TO REPORT IMMEDIATELY: A gastroenterologist can be reached at any hour.  During normal business hours, 8:30 AM to 5:00 PM Monday through Friday,  call [phone].  After hours and on weekends, please call the GI answering service at [phone] who will take a message and have the physician on call contact you.   Following lower endoscopy (colonoscopy or flexible sigmoidoscopy):  Excessive amounts of blood in the stool  Significant tenderness or worsening of abdominal pains  Swelling of the abdomen that is new, acute  Fever of 100F or higher  FOLLOW UP: If any biopsies were taken you will be contacted by phone or by letter within the next 1-3 weeks.  Call your gastroenterologist if you have not heard about the biopsies in 3 weeks.  Our staff will call the home number listed on your records the next business day following your procedure to check on you and address any questions or concerns that you may have at that time regarding the information given to you following your procedure. This is a courtesy call and so if there is no answer at the home number and we have not heard from you through the emergency physician on call, we will assume that you have returned to your regular daily activities without incident.  SIGNATURES/CONFIDENTIALITY: You and/or your care partner have signed paperwork which will be entered into your electronic medical record.  These signatures attest to the fact that that the information above on your After Visit Summary has been reviewed and is understood.  Full responsibility of the confidentiality of this   discharge information lies with you and/or your care-partner.  Recommendations Next colonoscopy determined by pathology results; 5 or 10 years. Polyp handout provided to patient/care partner.

## 2014-01-25 NOTE — Progress Notes (Signed)
Stable to RR 

## 2014-01-25 NOTE — Op Note (Signed)
[place]  [address] [place], [id number]   COLONOSCOPY PROCEDURE REPORT  PATIENT: Rachel [name], Rachel [name]  MR#: [id number] BIRTHDATE: 1950-04-23 , 59  yrs. old GENDER: female ENDOSCOPIST: Inda Castle, MD REFERRED XT:[id number] Nyoka Cowden, M.D. PROCEDURE DATE:  01/25/2014 PROCEDURE:   Colonoscopy with snare polypectomy First Screening Colonoscopy - Avg.  risk and is 50 yrs.  old or older - No.  Prior Negative Screening - Now for repeat screening. 10 or more years since last screening  History of Adenoma - Now for follow-up colonoscopy & has been > or = to 3 yrs.  N/A  Polyps Removed Today? Yes. ASA CLASS:   Class II INDICATIONS:average risk for colon cancer. MEDICATIONS: Monitored anesthesia care and Propofol 200 mg IV  DESCRIPTION OF PROCEDURE:   After the risks benefits and alternatives of the procedure were thoroughly explained, informed consent was obtained.  The digital rectal exam revealed no abnormalities of the rectum.   The [id number]  endoscope was introduced through the anus and advanced to the cecum, which was identified by both the appendix and ileocecal valve. No adverse events experienced.   The quality of the prep was excellent using Suprep  The instrument was then slowly withdrawn as the colon was fully examined.      COLON FINDINGS: A semi-pedunculated polyp measuring 4 mm in size was found in the descending colon.  A polypectomy was performed with a cold snare.  The resection was complete, the polyp tissue was completely retrieved and sent to histology.   The examination was otherwise normal.  Retroflexed views revealed no abnormalities. The time to cecum=3 minutes 01 seconds.  Withdrawal time=7 minutes 09 seconds.  The scope was withdrawn and the procedure completed. COMPLICATIONS: There were no immediate complications.  ENDOSCOPIC IMPRESSION: 1.   Semi-pedunculated polyp was found in the descending colon; polypectomy was performed  with a cold snare 2.   The examination was otherwise normal  RECOMMENDATIONS: If the polyp(s) removed today are proven to be adenomatous (pre-cancerous) polyps, you will need a repeat colonoscopy in 5 years.  Otherwise you should continue to follow colorectal cancer screening guidelines for "routine risk" patients with colonoscopy in 10 years.  You will receive a letter within 1-2 weeks with the results of your biopsy as well as final recommendations.  Please call my office if you have not received a letter after 3 weeks.  eSigned:  Inda Castle, MD 01/25/2014 11:23 AM   cc:   PATIENT NAME:  Rachel [name], Rachel [name] MR#: [id number]

## 2014-01-28 ENCOUNTER — Telehealth: Payer: Self-pay

## 2014-01-28 NOTE — Telephone Encounter (Signed)
  Follow up Call-  Call back number 01/25/2014  Post procedure Call Back phone  # [phone]  Permission to leave phone message Yes     Patient questions:  Do you have a fever, pain , or abdominal swelling? No. Pain Score  0 *  Have you tolerated food without any problems? Yes.    Have you been able to return to your normal activities? Yes.    Do you have any questions about your discharge instructions: Diet   No. Medications  No. Follow up visit  No.  Do you have questions or concerns about your Care? No.  Actions: * If pain score is 4 or above: No action needed, pain <4.

## 2014-01-29 ENCOUNTER — Encounter: Payer: Self-pay | Admitting: Gastroenterology

## 2015-02-17 ENCOUNTER — Ambulatory Visit (INDEPENDENT_AMBULATORY_CARE_PROVIDER_SITE_OTHER): Payer: Managed Care, Other (non HMO) | Admitting: Physician Assistant

## 2015-02-17 VITALS — BP 142/80 | HR 63 | Temp 98.5°F | Resp 16 | Ht 61.0 in | Wt 151.2 lb

## 2015-02-17 DIAGNOSIS — K219 Gastro-esophageal reflux disease without esophagitis: Secondary | ICD-10-CM

## 2015-02-17 DIAGNOSIS — T148XXA Other injury of unspecified body region, initial encounter: Secondary | ICD-10-CM

## 2015-02-17 DIAGNOSIS — Z13220 Encounter for screening for lipoid disorders: Secondary | ICD-10-CM

## 2015-02-17 DIAGNOSIS — R05 Cough: Secondary | ICD-10-CM

## 2015-02-17 DIAGNOSIS — I10 Essential (primary) hypertension: Secondary | ICD-10-CM | POA: Diagnosis not present

## 2015-02-17 DIAGNOSIS — R1013 Epigastric pain: Secondary | ICD-10-CM

## 2015-02-17 DIAGNOSIS — R059 Cough, unspecified: Secondary | ICD-10-CM

## 2015-02-17 HISTORY — DX: Gastro-esophageal reflux disease without esophagitis: K21.9

## 2015-02-17 LAB — COMPREHENSIVE METABOLIC PANEL
ALT: 11 U/L (ref 6–29)
AST: 15 U/L (ref 10–35)
Albumin: 4.4 g/dL (ref 3.6–5.1)
Alkaline Phosphatase: 56 U/L (ref 33–130)
BUN: 17 mg/dL (ref 7–25)
CALCIUM: 9.6 mg/dL (ref 8.6–10.4)
CO2: 28 mmol/L (ref 20–31)
Chloride: 103 mmol/L (ref 98–110)
Creat: 0.61 mg/dL (ref 0.50–0.99)
GLUCOSE: 97 mg/dL (ref 65–99)
Potassium: 4 mmol/L (ref 3.5–5.3)
SODIUM: 140 mmol/L (ref 135–146)
Total Bilirubin: 0.9 mg/dL (ref 0.2–1.2)
Total Protein: 7.2 g/dL (ref 6.1–8.1)

## 2015-02-17 LAB — POCT CBC
GRANULOCYTE PERCENT: 61.6 % (ref 37–80)
HCT, POC: 39.3 % (ref 37.7–47.9)
Hemoglobin: 13.1 g/dL (ref 12.2–16.2)
Lymph, poc: 2 (ref 0.6–3.4)
MCH, POC: 28.4 pg (ref 27–31.2)
MCHC: 33.4 g/dL (ref 31.8–35.4)
MCV: 85.1 fL (ref 80–97)
MID (cbc): 0.4 (ref 0–0.9)
MPV: 9.9 fL (ref 0–99.8)
POC Granulocyte: 3.9 (ref 2–6.9)
POC LYMPH PERCENT: 31.8 %L (ref 10–50)
POC MID %: 6.6 %M (ref 0–12)
Platelet Count, POC: 165 10*3/uL (ref 142–424)
RBC: 4.62 M/uL (ref 4.04–5.48)
RDW, POC: 13.4 %
WBC: 6.4 10*3/uL (ref 4.6–10.2)

## 2015-02-17 LAB — LIPID PANEL
CHOLESTEROL: 159 mg/dL (ref 125–200)
HDL: 98 mg/dL (ref >=46)
LDL CALC: 55 mg/dL (ref <130)
TRIGLYCERIDES: 30 mg/dL (ref <150)
Total CHOL/HDL Ratio: 1.6 Ratio (ref <=5.0)
VLDL: 6 mg/dL (ref <30)

## 2015-02-17 MED ORDER — BENZONATATE 100 MG PO CAPS: 100.0000 mg | ORAL_CAPSULE | Freq: Three times a day (TID) | ORAL | Status: DC | PRN

## 2015-02-17 MED ORDER — HYDROCOD POLST-CPM POLST ER 10-8 MG/5ML PO SUER: 5.0000 mL | Freq: Two times a day (BID) | ORAL | Status: DC | PRN

## 2015-02-17 MED ORDER — AMLODIPINE BESYLATE 5 MG PO TABS: 5.0000 mg | ORAL_TABLET | Freq: Every day | ORAL | Status: DC

## 2015-02-17 NOTE — Progress Notes (Signed)
[facility] [address], [place] [address] [phone]  Date:  02/17/2015   Name:  Rachel [name]   DOB:  1950/03/24   MRN:  [id number]  PCP:  Criselda Peaches, MD    Chief Complaint: Cough and Gastroesophageal Reflux   History of Present Illness:  This is a 65 y.o. female with PMH HTN, GERD who is presenting with cough x 5 days. Cough is dry. Worse at night. She states cough is not as bad when she gets going during the day. She states last night she started having epigastric and anterior chest pain. Does not hurt at rest. Hurts with palpation and coughing. No sob or wheezing. No fever or chills. Did have nasal congestion and sore throat initially but this has resolved. No hx env allergies, asthma. Not a smoker.  Pt takes omeprazole daily. Has been taking daily for 5-6 years. States intermittently she will  Have epigastric pain and will feel like swelling. She has felt like she has epigastric swelling since URI began.  Goes [facility] Adrian [facility] at [facility] for [facility] physicals.  She states she has never been told she has HTN. Never been put on meds. As far back as 2014 she has elevated BP 140-166/70-80s. She has occ headaches. No cp, blurred vision, sob, palps, dizziness.  Declined flu vaccine.  Pt up to date on colonoscopy and mammogram.  Review of Systems:  Review of Systems See HPI  Patient Active Problem List   Diagnosis Date Noted  . Chest pain 11/30/2012  . Essential hypertension 11/30/2012    Prior to Admission medications   Medication Sig Start Date End Date Taking? Authorizing Provider  acyclovir (ZOVIRAX) 400 MG tablet Take 800 mg by mouth daily.   Yes Historical Provider, MD  estrogens, conjugated, (PREMARIN) 0.3 MG tablet Take 0.3 mg by mouth daily.   Yes Historical Provider, MD  Iron-Vitamins (GERITOL PO) Take by mouth daily.   Yes Historical Provider, MD  Multiple Vitamin (MULTIVITAMIN WITH MINERALS) TABS tablet Take 1 tablet  by mouth daily.   Yes Historical Provider, MD  omeprazole (PRILOSEC) 20 MG capsule Take 20 mg by mouth daily.   Yes Historical Provider, MD    No Known Allergies  Past Surgical History  Procedure Laterality Date  . Abdominal hysterectomy    . Colonoscopy      Social History  Substance Use Topics  . Smoking status: Never Smoker   . Smokeless tobacco: Never Used  . Alcohol Use: No    Family History  Problem Relation Age of Onset  . Cancer Mother   . Stroke Mother   . Heart disease Father   . Colon cancer Maternal Grandmother     pt unsure, MGM might have had colon CA  . Esophageal cancer Neg Hx   . Stomach cancer Neg Hx   . Rectal cancer Neg Hx     Medication list has been reviewed and updated.  Physical Examination:  Physical Exam  Constitutional: She is oriented to person, place, and time. She appears well-developed and well-nourished. No distress.  HENT:  Head: Normocephalic and atraumatic.  Right Ear: Hearing, tympanic membrane, external ear and ear canal normal.  Left Ear: Hearing, tympanic membrane, external ear and ear canal normal.  Nose: Nose normal.  Mouth/Throat: Uvula is midline, oropharynx is clear and moist and mucous membranes are normal.  Eyes: Conjunctivae and lids are normal. Right eye exhibits no discharge. Left eye exhibits no discharge. No  scleral icterus.  Neck: Trachea normal. Carotid bruit is not present. No thyromegaly present.  Cardiovascular: Normal rate, regular rhythm and normal pulses.   No murmur heard. Pulmonary/Chest: Effort normal and breath sounds normal. No respiratory distress. She has no wheezes. She has no rhonchi. She has no rales. She exhibits tenderness (left chest and mid-anterior chest, see depiction).    Abdominal: Soft. Normal appearance. There is tenderness (mild) in the epigastric area.  Musculoskeletal: Normal range of motion.  No back tenderness  Lymphadenopathy:       Head (right side): No submental, no  submandibular and no tonsillar adenopathy present.       Head (left side): No submental, no submandibular and no tonsillar adenopathy present.    She has no cervical adenopathy.  Neurological: She is alert and oriented to person, place, and time.  Skin: Skin is warm, dry and intact. No lesion and no rash noted.  Psychiatric: She has a normal mood and affect. Her speech is normal and behavior is normal. Thought content normal.   BP 142/80 mmHg  Pulse 63  Temp(Src) 98.5 F (36.9 C) (Oral)  Resp 16  Ht 5\' 1"  (1.549 m)  Wt 151 lb 3.2 oz (68.584 kg)  BMI 28.58 kg/m2  SpO2 98%  LMP 08/01/2012  Results for orders placed or performed in visit on 02/17/15  POCT CBC  Result Value Ref Range   WBC 6.4 4.6 - 10.2 K/uL   Lymph, poc 2.0 0.6 - 3.4   POC LYMPH PERCENT 31.8 10 - 50 %L   MID (cbc) 0.4 0 - 0.9   POC MID % 6.6 0 - 12 %M   POC Granulocyte 3.9 2 - 6.9   Granulocyte percent 61.6 37 - 80 %G   RBC 4.62 4.04 - 5.48 M/uL   Hemoglobin 13.1 12.2 - 16.2 g/dL   HCT, POC 39.3 37.7 - 47.9 %   MCV 85.1 80 - 97 fL   MCH, POC 28.4 27 - 31.2 pg   MCHC 33.4 31.8 - 35.4 g/dL   RDW, POC 13.4 %   Platelet Count, POC 165 142 - 424 K/uL   MPV 9.9 0 - 99.8 fL    Assessment and Plan:  1. Cough 2. Muscle strain Exam benign. Vitals wnl. Pt feeling well other than cough and muscle soreness of chest. Treat as viral uri with tessalon and tussionex. Tylenol 1-2 tabs TID for muscle strain. Apply heat. Return in 1-2 weeks if symptoms not improving or at any time if sx worsen. - POCT CBC - chlorpheniramine-HYDROcodone (TUSSIONEX PENNKINETIC ER) 10-8 MG/5ML SUER; Take 5 mLs by mouth every 12 (twelve) hours as needed for cough.  Dispense: 100 mL; Refill: 0 - benzonatate (TESSALON) 100 MG capsule; Take 1-2 capsules (100-200 mg total) by mouth 3 (three) times daily as needed for cough.  Dispense: 40 capsule; Refill: 0  3. Epigastric pain Continue prilosec. CMP and H pylori pending. - Comprehensive metabolic  panel - H. pylori breath test  4. Lipid screening - Lipid panel  5. Essential hypertension Start amlodipine 5 mg. CMP pending. Buy BP monitor and take daily and keep record. Return in 1-2 months for follow up. - Comprehensive metabolic panel - amLODipine (NORVASC) 5 MG tablet; Take 1 tablet (5 mg total) by mouth daily.  Dispense: 30 tablet; Refill: 5   Nicole V. Drenda Freeze, MHS [facility]  02/17/2015

## 2015-02-17 NOTE — Patient Instructions (Signed)
For your cough - take tessalon during the day. Take cough syrup at night. This will suppress your cough and help you sleep. Take tylenol three times a day for pain in chest/upper abdomen. Apply heating pad. Rest - I can write for light duty at work.  Start taking amlodipine daily for blood pressure. Buy a monitor and take your bp at home and bring to next appt. I will call you with your lab results.

## 2015-02-18 ENCOUNTER — Telehealth: Payer: Self-pay

## 2015-02-18 ENCOUNTER — Encounter: Payer: Self-pay | Admitting: *Deleted

## 2015-02-18 LAB — H. PYLORI BREATH TEST: H. pylori Breath Test: NOT DETECTED

## 2015-02-18 NOTE — Telephone Encounter (Signed)
Pt stated that the Tessalon perles was making her sleepy.  She was taking them constantly 3 times day.  Advised her to only take them as needed.  Note written for two more days. Advised that if she cannot go back to work on Friday then she would need to return for a recheck for more time from work.

## 2015-02-18 NOTE — Telephone Encounter (Signed)
Pt states she was given an oow note for yesterday and would like to have another one for 2 more days. Still have the cough and the medication makes her sleepy Please call [phone] and she will come pick up

## 2015-08-10 ENCOUNTER — Other Ambulatory Visit: Payer: Self-pay | Admitting: Physician Assistant

## 2015-09-10 ENCOUNTER — Other Ambulatory Visit: Payer: Self-pay | Admitting: Urgent Care

## 2015-09-13 ENCOUNTER — Other Ambulatory Visit: Payer: Self-pay | Admitting: Urgent Care

## 2015-10-10 ENCOUNTER — Other Ambulatory Visit: Payer: Self-pay | Admitting: Physician Assistant

## 2015-10-11 ENCOUNTER — Ambulatory Visit (INDEPENDENT_AMBULATORY_CARE_PROVIDER_SITE_OTHER): Payer: Managed Care, Other (non HMO)

## 2015-10-11 ENCOUNTER — Ambulatory Visit (INDEPENDENT_AMBULATORY_CARE_PROVIDER_SITE_OTHER): Payer: Managed Care, Other (non HMO) | Admitting: Family Medicine

## 2015-10-11 ENCOUNTER — Telehealth: Payer: Self-pay

## 2015-10-11 ENCOUNTER — Other Ambulatory Visit: Payer: Self-pay | Admitting: *Deleted

## 2015-10-11 ENCOUNTER — Encounter: Payer: Self-pay | Admitting: Family Medicine

## 2015-10-11 VITALS — BP 124/70 | HR 70 | Temp 98.0°F | Resp 16 | Ht 61.0 in | Wt 148.0 lb

## 2015-10-11 DIAGNOSIS — R1013 Epigastric pain: Secondary | ICD-10-CM | POA: Diagnosis not present

## 2015-10-11 DIAGNOSIS — H1131 Conjunctival hemorrhage, right eye: Secondary | ICD-10-CM | POA: Diagnosis not present

## 2015-10-11 DIAGNOSIS — K219 Gastro-esophageal reflux disease without esophagitis: Secondary | ICD-10-CM | POA: Diagnosis not present

## 2015-10-11 DIAGNOSIS — I10 Essential (primary) hypertension: Secondary | ICD-10-CM

## 2015-10-11 DIAGNOSIS — R140 Abdominal distension (gaseous): Secondary | ICD-10-CM | POA: Diagnosis not present

## 2015-10-11 LAB — POCT URINALYSIS DIP (MANUAL ENTRY)
BILIRUBIN UA: NEGATIVE
GLUCOSE UA: NEGATIVE
Ketones, POC UA: NEGATIVE
Nitrite, UA: NEGATIVE
Protein Ur, POC: NEGATIVE
RBC UA: NEGATIVE
SPEC GRAV UA: 1.025
Urobilinogen, UA: 1
pH, UA: 6.5

## 2015-10-11 LAB — COMPREHENSIVE METABOLIC PANEL
ALT: 10 U/L (ref 6–29)
AST: 14 U/L (ref 10–35)
Albumin: 4.2 g/dL (ref 3.6–5.1)
Alkaline Phosphatase: 56 U/L (ref 33–130)
BUN: 17 mg/dL (ref 7–25)
CALCIUM: 9.1 mg/dL (ref 8.6–10.4)
CO2: 26 mmol/L (ref 20–31)
Chloride: 106 mmol/L (ref 98–110)
Creat: 0.66 mg/dL (ref 0.50–0.99)
GLUCOSE: 118 mg/dL — AB (ref 65–99)
POTASSIUM: 4 mmol/L (ref 3.5–5.3)
Sodium: 141 mmol/L (ref 135–146)
Total Bilirubin: 0.6 mg/dL (ref 0.2–1.2)
Total Protein: 6.5 g/dL (ref 6.1–8.1)

## 2015-10-11 LAB — POCT CBC
GRANULOCYTE PERCENT: 54.7 % (ref 37–80)
HCT, POC: 34.1 % — AB (ref 37.7–47.9)
HEMOGLOBIN: 11.8 g/dL — AB (ref 12.2–16.2)
Lymph, poc: 2.6 (ref 0.6–3.4)
MCH: 28.9 pg (ref 27–31.2)
MCHC: 34.6 g/dL (ref 31.8–35.4)
MCV: 83.5 fL (ref 80–97)
MID (CBC): 0.4 (ref 0–0.9)
MPV: 9.6 fL (ref 0–99.8)
PLATELET COUNT, POC: 178 10*3/uL (ref 142–424)
POC Granulocyte: 3.6 (ref 2–6.9)
POC LYMPH PERCENT: 39.5 %L (ref 10–50)
POC MID %: 5.8 % (ref 0–12)
RBC: 4.09 M/uL (ref 4.04–5.48)
RDW, POC: 13.6 %
WBC: 6.5 10*3/uL (ref 4.6–10.2)

## 2015-10-11 MED ORDER — AMLODIPINE BESYLATE 5 MG PO TABS: 5.0000 mg | ORAL_TABLET | Freq: Every day | ORAL | 0 refills | Status: DC

## 2015-10-11 MED ORDER — RANITIDINE HCL 150 MG PO TABS: 150.0000 mg | ORAL_TABLET | Freq: Every day | ORAL | 0 refills | Status: DC

## 2015-10-11 NOTE — Telephone Encounter (Signed)
Lm patient will need a follow up before this refill runs out.
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
## 2015-10-11 NOTE — Patient Instructions (Signed)
IF you received an x-ray today, you will receive an invoice from [facility]. Please contact [facility] at [phone] with questions or concerns regarding your invoice.   IF you received labwork today, you will receive an invoice from [employer]. Please contact [facility] at [phone] with questions or concerns regarding your invoice.   Our billing staff will not be able to assist you with questions regarding bills from these companies.  You will be contacted with the lab results as soon as they are available. The fastest way to get your results is to activate your My Chart account. Instructions are located on the last page of this paperwork. If you have not heard from [place] regarding the results in 2 weeks, please contact this office.      Subconjunctival Hemorrhage Subconjunctival hemorrhage is bleeding that happens between the white part of your eye (sclera) and the clear membrane that covers the outside of your eye (conjunctiva). There are many tiny blood vessels near the surface of your eye. A subconjunctival hemorrhage happens when one or more of these vessels breaks and bleeds, causing a red patch to appear on your eye. This is similar to a bruise. Depending on the amount of bleeding, the red patch may only cover a small area of your eye or it may cover the entire visible part of the sclera. If a lot of blood collects under the conjunctiva, there may also be swelling. Subconjunctival hemorrhages do not affect your vision or cause pain, but your eye may feel irritated if there is swelling. Subconjunctival hemorrhages usually do not require treatment, and they disappear on their own within two weeks. CAUSES This condition may be caused by:  Mild trauma, such as rubbing your eye too hard.  Severe trauma or blunt injuries.  Coughing, sneezing, or vomiting.  Straining, such as when lifting a heavy object.  High blood pressure.  Recent  eye surgery.  A history of diabetes.  Certain medicines, especially blood thinners (anticoagulants).  Other conditions, such as eye tumors, bleeding disorders, or blood vessel abnormalities. Subconjunctival hemorrhages can happen without an obvious cause.  SYMPTOMS  Symptoms of this condition include:  A bright red or dark red patch on the white part of the eye.  The red area may spread out to cover a larger area of the eye before it goes away.  The red area may turn brownish-yellow before it goes away.  Swelling.  Mild eye irritation. DIAGNOSIS This condition is diagnosed with a physical exam. If your subconjunctival hemorrhage was caused by trauma, your health care provider may refer you to an eye specialist (ophthalmologist) or another specialist to check for other injuries. You may have other tests, including:  An eye exam.  A blood pressure check.  Blood tests to check for bleeding disorders. If your subconjunctival hemorrhage was caused by trauma, X-rays or a CT scan may be done to check for other injuries. TREATMENT Usually, no treatment is needed. Your health care provider may recommend eye drops or cold compresses to help with discomfort. HOME CARE INSTRUCTIONS  Take over-the-counter and prescription medicines only as directed by your health care provider.  Use eye drops or cold compresses to help with discomfort as directed by your health care provider.  Avoid activities, things, and environments that may irritate or injure your eye.  Keep all follow-up visits as told by your health care provider. This is important. SEEK MEDICAL CARE IF:  You have pain in your  eye.  The bleeding does not go away within 3 weeks.  You keep getting new subconjunctival hemorrhages. SEEK IMMEDIATE MEDICAL CARE IF:  Your vision changes or you have difficulty seeing.  You suddenly develop severe sensitivity to light.  You develop a severe headache, persistent vomiting,  confusion, or abnormal tiredness (lethargy).  Your eye seems to bulge or protrude from your eye socket.  You develop unexplained bruises on your body.  You have unexplained bleeding in another area of your body.   This information is not intended to replace advice given to you by your health care provider. Make sure you discuss any questions you have with your health care provider.   Document Released: 01/04/2005 Document Revised: 09/25/2014 Document Reviewed: 03/13/2014 Elsevier Interactive Patient Education [employer].

## 2015-10-11 NOTE — Telephone Encounter (Signed)
Pt is completely out of blood pressure pills she took the last one today   Best number [phone]

## 2015-10-12 ENCOUNTER — Other Ambulatory Visit: Payer: Self-pay | Admitting: Physician Assistant

## 2015-10-14 NOTE — Telephone Encounter (Signed)
LMOVM for pt to RTC Spoke with husband on cell - he will give pt the message.

## 2015-10-17 ENCOUNTER — Ambulatory Visit (INDEPENDENT_AMBULATORY_CARE_PROVIDER_SITE_OTHER): Payer: Managed Care, Other (non HMO)

## 2015-10-17 ENCOUNTER — Ambulatory Visit (INDEPENDENT_AMBULATORY_CARE_PROVIDER_SITE_OTHER): Payer: Managed Care, Other (non HMO) | Admitting: Family Medicine

## 2015-10-17 VITALS — BP 134/64 | HR 62 | Temp 97.8°F | Resp 16 | Ht 61.0 in | Wt 146.8 lb

## 2015-10-17 DIAGNOSIS — R079 Chest pain, unspecified: Secondary | ICD-10-CM | POA: Diagnosis not present

## 2015-10-17 DIAGNOSIS — R0602 Shortness of breath: Secondary | ICD-10-CM | POA: Diagnosis not present

## 2015-10-17 DIAGNOSIS — I10 Essential (primary) hypertension: Secondary | ICD-10-CM

## 2015-10-17 MED ORDER — AMLODIPINE BESYLATE 5 MG PO TABS: 5.0000 mg | ORAL_TABLET | Freq: Every day | ORAL | 6 refills | Status: DC

## 2015-10-17 NOTE — Patient Instructions (Addendum)
I have referred you to cardiology for further evaluation of your atypical chest pain. Continue aspirin daily 81 mg. Take blood pressure occasional and keep  record of readings to ensure dosage of anti-hypertensive medication is effective.   IF you received an x-ray today, you will receive an invoice from [facility]. Please contact [facility] at [phone] with questions or concerns regarding your invoice.   IF you received labwork today, you will receive an invoice from [employer]. Please contact [facility] at [phone] with questions or concerns regarding your invoice.   Our billing staff will not be able to assist you with questions regarding bills from these companies.  You will be contacted with the lab results as soon as they are available. The fastest way to get your results is to activate your My Chart account. Instructions are located on the last page of this paperwork. If you have not heard from [place] regarding the results in 2 weeks, please contact this office.     Angina Pectoris Angina pectoris is a very bad feeling in the chest, neck, or arm. Your doctor may call it angina. There are four types of angina. Angina is caused by a lack of blood in the middle and thickest layer of the heart wall (myocardium). Angina may feel like a crushing or squeezing pain in the chest. It may feel like tightness or heavy pressure in the chest. Some people say it feels like gas, heartburn, or indigestion. Some people have symptoms other than pain. These include:  Shortness of breath.  Cold sweats.  Feeling sick to your stomach (nausea).  Feeling light-headed. Many women have chest discomfort and some of the other symptoms. However, women often have different symptoms, such as:  Feeling tired (fatigue).  Feeling nervous for no reason.  Feeling weak for no reason.  Dizziness or fainting. Women may have angina without any symptoms. HOME  CARE  Take medicines only as told by your doctor.  Take care of other health issues as told by your doctor. These include:  High blood pressure (hypertension).  Diabetes.  Follow a heart-healthy diet. Your doctor can help you to choose healthy food options and make changes.  Talk to your doctor to learn more about healthy cooking methods and use them. These include:  Roasting.  Grilling.  Broiling.  Baking.  Poaching.  Steaming.  Stir-frying.  Follow an exercise program approved by your doctor.  Keep a healthy weight. Lose weight as told by your doctor.  Rest when you are tired.  Learn to manage stress.  Do not use any tobacco, such as cigarettes, chewing tobacco, or electronic cigarettes. If you need help quitting, ask your doctor.  If you drink alcohol, and your doctor says it is okay, limit yourself to no more than 1 drink per day. One drink equals 12 ounces of beer, 5 ounces of wine, or 1 ounces of hard liquor.  Stop illegal drug use.  Keep all follow-up visits as told by your doctor. This is important. Do not take these medicines unless your doctor says that you can:  Nonsteroidal anti-inflammatory drugs (NSAIDs). These include:  Ibuprofen.  Naproxen.  Celecoxib.  Vitamin supplements that have vitamin A, vitamin E, or both.  Hormone therapy that contains estrogen with or without progestin. GET HELP RIGHT AWAY IF:  You have pain in your chest, neck, arm, jaw, stomach, or back that:  Lasts more than a few minutes.  Comes back.  Does not get better after you  take medicine under your tongue (sublingual nitroglycerin).  You have any of these symptoms for no reason:  Gas, heartburn, or indigestion.  Sweating a lot.  Shortness of breath or trouble breathing.  Feeling sick to your stomach or throwing up.  Feeling more tired than usual.  Feeling nervous or worrying more than usual.  Feeling weak.  Diarrhea.  You are suddenly dizzy or  light-headed.  You faint or pass out. These symptoms may be an emergency. Do not wait to see if the symptoms will go away. Get medical help right away. Call your local emergency services (911 in the [place] Do not drive yourself to the hospital.   This information is not intended to replace advice given to you by your health care provider. Make sure you discuss any questions you have with your health care provider.   Document Released: 06/23/2007 Document Revised: 05/21/2014 Document Reviewed: 05/08/2013 Elsevier Interactive Patient Education [employer].

## 2015-10-17 NOTE — Progress Notes (Signed)
Patient ID: Rachel [name], female    DOB: 11-10-1950, 65 y.o.   MRN: [id number]  PCP: Criselda Peaches, MD  Chief Complaint  Patient presents with  . Follow-up    BLOOD PRESSURE    Subjective:   HPI 65 year old presents for evaluation of hypertension. She occasionally checks her blood pressures but doesn't recall any of her readings . Patient reports an occasional headache in the  frontal region that goes away without medication.   Reports some intermittent chest pain lasting 2 or 3 minutes, left lateral upper mid chest wall.  She had similar pain 3 years prior and reports a negative stress test. Although chart review lists a future ordered stress test 12/05/2012 and myocardial perfusion imaging  12/01/2012 were ordered and not completed. She reports that the most recent episodes of Intermittent chest pain started again about 3 months ago. She was recently treated on  10/11/15 for GERD exacerbation that has since improve with treatment. Occasional shortness of breath with activity that resolves with rest. Work for full time [employer], hours per day 8 hours and reports regular activity and no episode of fatigue.   Review of Systems See HPI Patient Active Problem List   Diagnosis Date Noted  . Esophageal reflux 02/17/2015  . Essential hypertension 11/30/2012     Prior to Admission medications   Medication Sig Start Date End Date Taking? Authorizing Provider  acyclovir (ZOVIRAX) 400 MG tablet Take 800 mg by mouth daily.   Yes Historical Provider, MD  amLODipine (NORVASC) 5 MG tablet Take 1 tablet (5 mg total) by mouth daily. No more refills without office visit 10/11/15  Yes Chelle Jeffery, PA-C  aspirin 81 MG chewable tablet Chew by mouth daily.   Yes Historical Provider, MD  estrogens, conjugated, (PREMARIN) 0.3 MG tablet Take 0.3 mg by mouth daily.   Yes Historical Provider, MD  Multiple Vitamin (MULTIVITAMIN WITH MINERALS) TABS tablet Take 1 tablet by mouth daily.   Yes  Historical Provider, MD  ranitidine (ZANTAC) 150 MG tablet Take 1 tablet (150 mg total) by mouth at bedtime. 10/11/15  Yes Wardell Honour, MD  Iron-Vitamins (GERITOL PO) Take by mouth daily.    Historical Provider, MD  omeprazole (PRILOSEC) 20 MG capsule Take 20 mg by mouth daily.    Historical Provider, MD  No Known Allergies     Objective:  Physical Exam  Constitutional: She appears well-developed and well-nourished. No distress.  HENT:  Head: Normocephalic and atraumatic.  Right Ear: External ear normal.  Left Ear: External ear normal.  Nose: Nose normal.  Eyes: Conjunctivae and EOM are normal. Pupils are equal, round, and reactive to light.  Neck: Normal range of motion. Neck supple.  Cardiovascular: Normal rate, regular rhythm, normal heart sounds and intact distal pulses.   Pulmonary/Chest: Effort normal and breath sounds normal. No respiratory distress. She has no rales. She exhibits no tenderness.  Musculoskeletal: Normal range of motion.  Neurological: She is alert.  Skin: Skin is warm and dry. She is not diaphoretic.  Psychiatric: She has a normal mood and affect. Her behavior is normal. Judgment and thought content normal.     Vitals:   10/17/15 0813 10/17/15 0820  BP: (!) 146/62 134/64  Pulse: 62   Resp: 16   Temp: 97.8 F (36.6 C)    Assessment & Plan:  Precepted assessment and plan with Dr. Brigitte Pulse  1. Essential hypertension, elevated but stable. Patient reported not having any blood pressure medication today and  therefore reading was elevated.  Plan: Continue Amlodipine 5 mg daily. Periodically check blood pressure and record readings. Return in 6 months for blood pressure follow-up.  2. Shortness of breath Chest x-ray was negative for any acute pulmonary or cardiac concerns. Dyspnea may be related to over exertion and or possible underlying cardiac problem. Physical exam was negative of any acute findings.  Plan: Patient has been referred to cardiology.  Advised to return if dyspnea begins to occur at rest.  3. Chest pain, unspecified chest pain type   During HPI for hypertension encounter, patient revealed that she has been experiencing atypical chest pain lasting 2-3 minutes over the last 2-3 months. She has a history of similar incident back in 11/2012 in which she was referred for exercise stress test and echocardiogram.  After further evaluation of the EMR, the patient never received the tests and the orders remain in the system. Patient received a EKG on 10/11/15.  In review of her last EKG in 2014, there were no apparent changes.   Plan: -Ambulatory referral to Cardiology for further evaluation.

## 2015-11-07 ENCOUNTER — Other Ambulatory Visit: Payer: Self-pay | Admitting: Family Medicine

## 2016-01-08 ENCOUNTER — Encounter: Payer: Self-pay | Admitting: Physician Assistant

## 2016-01-08 ENCOUNTER — Ambulatory Visit (INDEPENDENT_AMBULATORY_CARE_PROVIDER_SITE_OTHER): Payer: Managed Care, Other (non HMO) | Admitting: Physician Assistant

## 2016-01-08 VITALS — BP 124/70 | HR 73 | Temp 98.7°F | Ht 61.0 in | Wt 147.2 lb

## 2016-01-08 DIAGNOSIS — B9789 Other viral agents as the cause of diseases classified elsewhere: Secondary | ICD-10-CM | POA: Diagnosis not present

## 2016-01-08 DIAGNOSIS — J069 Acute upper respiratory infection, unspecified: Secondary | ICD-10-CM | POA: Diagnosis not present

## 2016-01-08 MED ORDER — GUAIFENESIN ER 1200 MG PO TB12: 1.0000 | ORAL_TABLET | Freq: Two times a day (BID) | ORAL | 1 refills | Status: DC | PRN

## 2016-01-08 MED ORDER — HYDROCOD POLST-CPM POLST ER 10-8 MG/5ML PO SUER
5.0000 mL | Freq: Every evening | ORAL | 0 refills | Status: DC | PRN
Start: 2016-01-08 — End: 2016-03-15

## 2016-01-08 MED ORDER — BENZONATATE 100 MG PO CAPS
100.0000 mg | ORAL_CAPSULE | Freq: Three times a day (TID) | ORAL | 0 refills | Status: DC | PRN
Start: 2016-01-08 — End: 2016-03-15

## 2016-01-08 NOTE — Patient Instructions (Addendum)
Please hydrate well with 64 oz of water per day.   Take the tessalon pearls during the day, and the tussionex syrup for nighttime cough. Let me know if you have no improvement within the next 6-7 days.    Upper Respiratory Infection, Adult Most upper respiratory infections (URIs) are caused by a virus. A URI affects the nose, throat, and upper air passages. The most common type of URI is often called "the common cold." Follow these instructions at home:  Take medicines only as told by your doctor.  Gargle warm saltwater or take cough drops to comfort your throat as told by your doctor.  Use a warm mist humidifier or inhale steam from a shower to increase air moisture. This may make it easier to breathe.  Drink enough fluid to keep your pee (urine) clear or pale yellow.  Eat soups and other clear broths.  Have a healthy diet.  Rest as needed.  Go back to work when your fever is gone or your doctor says it is okay.  You may need to stay home longer to avoid giving your URI to others.  You can also wear a face mask and wash your hands often to prevent spread of the virus.  Use your inhaler more if you have asthma.  Do not use any tobacco products, including cigarettes, chewing tobacco, or electronic cigarettes. If you need help quitting, ask your doctor. Contact a doctor if:  You are getting worse, not better.  Your symptoms are not helped by medicine.  You have chills.  You are getting more short of breath.  You have brown or red mucus.  You have yellow or brown discharge from your nose.  You have pain in your face, especially when you bend forward.  You have a fever.  You have puffy (swollen) neck glands.  You have pain while swallowing.  You have white areas in the back of your throat. Get help right away if:  You have very bad or constant:  Headache.  Ear pain.  Pain in your forehead, behind your eyes, and over your cheekbones (sinus pain).  Chest  pain.  You have long-lasting (chronic) lung disease and any of the following:  Wheezing.  Long-lasting cough.  Coughing up blood.  A change in your usual mucus.  You have a stiff neck.  You have changes in your:  Vision.  Hearing.  Thinking.  Mood. This information is not intended to replace advice given to you by your health care provider. Make sure you discuss any questions you have with your health care provider. Document Released: 06/23/2007 Document Revised: 09/07/2015 Document Reviewed: 04/11/2013 Elsevier Interactive Patient Education  2017 [employer].     IF you received an x-ray today, you will receive an invoice from [facility]. Please contact [facility] at [phone] with questions or concerns regarding your invoice.   IF you received labwork today, you will receive an invoice from [place]. Please contact [facility] at [phone] with questions or concerns regarding your invoice.   Our billing staff will not be able to assist you with questions regarding bills from these companies.  You will be contacted with the lab results as soon as they are available. The fastest way to get your results is to activate your My Chart account. Instructions are located on the last page of this paperwork. If you have not heard from [place] regarding the results in 2 weeks, please contact this office.

## 2016-01-08 NOTE — Progress Notes (Signed)
[facility] [address] [place] [address] [phone]  Date:  01/08/2016   Name:  Rachel [name]   DOB:  1951-01-10   MRN:  [id number]  PCP:  Criselda Peaches, MD    History of Present Illness:  Rachel [name] is a 65 y.o. female patient who presents to [facility] For chief complaint of cough  and sore throat. Patient reports 4 days of a runny noseand minimal congestion. He also has sore throat. Her cough is nonproductive. It is worse at night.  No shortness of breath or trouble breathing.   She took alka-seltzer pills.  She took last night, coughing all night.   Phlegm coughing.  No sob or dyspnea.  Fatigue.  No sinus pain, but did have a headache. She woke up this morning in a sweat.  Subjective fever and chills.  Some bodyaches.  Sick contacts at work with respiratory illness, but unknown    Chief Complaint  Patient presents with  . Cough    X 3days runny nose  . Sore Throat    X 3days chest congestion      Patient Active Problem List   Diagnosis Date Noted  . Esophageal reflux 02/17/2015  . Atypical chest pain 11/30/2012  . Essential hypertension 11/30/2012    Past Medical History:  Diagnosis Date  . GERD (gastroesophageal reflux disease)   . Herpes   . Hypertension     Past Surgical History:  Procedure Laterality Date  . ABDOMINAL HYSTERECTOMY    . COLONOSCOPY      Social History  Substance Use Topics  . Smoking status: Never Smoker  . Smokeless tobacco: Never Used  . Alcohol use No    Family History  Problem Relation Age of Onset  . Cancer Mother   . Stroke Mother   . Heart disease Mother   . Heart disease Father   . Cancer Father     prostate cancer  . Colon cancer Maternal Grandmother     pt unsure, MGM might have had colon CA  . Esophageal cancer Neg Hx   . Stomach cancer Neg Hx   . Rectal cancer Neg Hx     No Known Allergies  Medication list has been reviewed and updated.  Current Outpatient  Prescriptions on File Prior to Visit  Medication Sig Dispense Refill  . acyclovir (ZOVIRAX) 400 MG tablet Take 800 mg by mouth daily.    Marland Kitchen amLODipine (NORVASC) 5 MG tablet Take 1 tablet (5 mg total) by mouth daily. No more refills without office visit 30 tablet 6  . aspirin 81 MG chewable tablet Chew by mouth daily.    Marland Kitchen estrogens, conjugated, (PREMARIN) 0.3 MG tablet Take 0.3 mg by mouth daily.    . Iron-Vitamins (GERITOL PO) Take by mouth daily.    . Multiple Vitamin (MULTIVITAMIN WITH MINERALS) TABS tablet Take 1 tablet by mouth daily.    Marland Kitchen omeprazole (PRILOSEC) 20 MG capsule Take 20 mg by mouth daily.    . ranitidine (ZANTAC) 150 MG tablet TAKE 1 TABLET (150 MG TOTAL) BY MOUTH AT BEDTIME. 30 tablet 0   No current facility-administered medications on file prior to visit.     ROS rOS otherwise unremarkable unless listed above.  Physical Examination: BP 124/70 (BP Location: Left Arm, Patient Position: Sitting, Cuff Size: Small)   Pulse 73   Temp 98.7 F (37.1 C) (Oral)   Ht 5\' 1"  (1.549 m)   Wt 147 lb 3.2  oz (66.8 kg)   LMP 08/01/2012   SpO2 100%   BMI 27.81 kg/m  Ideal Body Weight: Weight in (lb) to have BMI = 25: 132  Physical Exam  Constitutional: She is oriented to person, place, and time. She appears well-developed and well-nourished. No distress.  HENT:  Head: Normocephalic and atraumatic.  Right Ear: Tympanic membrane, external ear and ear canal normal.  Left Ear: Tympanic membrane, external ear and ear canal normal.  Nose: Mucosal edema and rhinorrhea present. Right sinus exhibits no maxillary sinus tenderness and no frontal sinus tenderness. Left sinus exhibits no maxillary sinus tenderness and no frontal sinus tenderness.  Mouth/Throat: No uvula swelling. No oropharyngeal exudate, posterior oropharyngeal edema or posterior oropharyngeal erythema.  Eyes: Conjunctivae and EOM are normal. Pupils are equal, round, and reactive to light.  Cardiovascular: Normal rate and  regular rhythm.  Exam reveals no gallop, no distant heart sounds and no friction rub.   No murmur heard. Pulmonary/Chest: Effort normal. No respiratory distress. She has no decreased breath sounds. She has no wheezes. She has no rhonchi.  Lymphadenopathy:       Head (right side): No submandibular, no tonsillar, no preauricular and no posterior auricular adenopathy present.       Head (left side): No submandibular, no tonsillar, no preauricular and no posterior auricular adenopathy present.  Neurological: She is alert and oriented to person, place, and time.  Skin: She is not diaphoretic.  Psychiatric: She has a normal mood and affect. Her behavior is normal.     Assessment and Plan: Rachel [name] is a 65 y.o. female who is here today for chief complaint of nasal congestion, sore throat, and cough. This appears to be viral at this time. We'll treat supportively.  zpak if she is not better within 6-7 days. Viral URI with cough - Plan: Guaifenesin (MUCINEX MAXIMUM STRENGTH) 1200 MG TB12, benzonatate (TESSALON) 100 MG capsule, chlorpheniramine-HYDROcodone (TUSSIONEX PENNKINETIC ER) 10-8 MG/5ML SUER   Ivar Drape, PA-C [facility] 01/08/2016 12:03 PM

## 2016-01-08 NOTE — Progress Notes (Deleted)
Sore throat, and ear pain 4 days ago.  She took alka-seltzer pills.  She took last night, coughing all night.   Phlegm coughing.  No sob or dyspnea.  Fatigue.  No sinus pain, but did have a ehadache. She woke up this morning in a sweat.  Subjective fever and chills.  Some bodyaches.  Sick contacts at work with respiratory illness, but unknown  zpak if she is not better within 6-7 days.

## 2016-01-13 ENCOUNTER — Telehealth: Payer: Self-pay

## 2016-01-13 NOTE — Telephone Encounter (Signed)
Pt would like her work note extended to include 12/26 and RTW on 01/14/16. She is not feeling any better.  She would like to come by and pick this up when ready. Please advise at [phone]

## 2016-01-14 NOTE — Telephone Encounter (Signed)
Ok to extend

## 2016-01-14 NOTE — Telephone Encounter (Signed)
That's fine to extend

## 2016-01-14 NOTE — Telephone Encounter (Signed)
Done Lm for patient

## 2016-03-15 ENCOUNTER — Encounter: Payer: Self-pay | Admitting: Physician Assistant

## 2016-03-15 ENCOUNTER — Emergency Department (HOSPITAL_COMMUNITY)
Admission: EM | Admit: 2016-03-15 | Discharge: 2016-03-15 | Disposition: A | Discharge status: Home / Self Care | Payer: Managed Care, Other (non HMO) | Attending: Emergency Medicine | Admitting: Emergency Medicine

## 2016-03-15 ENCOUNTER — Emergency Department (HOSPITAL_COMMUNITY): Payer: Managed Care, Other (non HMO)

## 2016-03-15 ENCOUNTER — Encounter (HOSPITAL_COMMUNITY): Payer: Self-pay | Admitting: Emergency Medicine

## 2016-03-15 ENCOUNTER — Ambulatory Visit (INDEPENDENT_AMBULATORY_CARE_PROVIDER_SITE_OTHER): Payer: Managed Care, Other (non HMO) | Admitting: Physician Assistant

## 2016-03-15 VITALS — BP 125/67 | HR 67 | Temp 98.3°F | Resp 16 | Ht 61.0 in | Wt 146.0 lb

## 2016-03-15 DIAGNOSIS — R51 Headache: Secondary | ICD-10-CM

## 2016-03-15 DIAGNOSIS — Z79899 Other long term (current) drug therapy: Secondary | ICD-10-CM | POA: Diagnosis not present

## 2016-03-15 DIAGNOSIS — R29810 Facial weakness: Secondary | ICD-10-CM

## 2016-03-15 DIAGNOSIS — I10 Essential (primary) hypertension: Secondary | ICD-10-CM | POA: Insufficient documentation

## 2016-03-15 DIAGNOSIS — Z7982 Long term (current) use of aspirin: Secondary | ICD-10-CM | POA: Insufficient documentation

## 2016-03-15 DIAGNOSIS — R519 Headache, unspecified: Secondary | ICD-10-CM

## 2016-03-15 DIAGNOSIS — Z5181 Encounter for therapeutic drug level monitoring: Secondary | ICD-10-CM | POA: Insufficient documentation

## 2016-03-15 LAB — CBC
HEMATOCRIT: 35.8 % — AB (ref 36.0–46.0)
HEMOGLOBIN: 11.4 g/dL — AB (ref 12.0–15.0)
MCH: 27.9 pg (ref 26.0–34.0)
MCHC: 31.8 g/dL (ref 30.0–36.0)
MCV: 87.5 fL (ref 78.0–100.0)
Platelets: 173 10*3/uL (ref 150–400)
RBC: 4.09 MIL/uL (ref 3.87–5.11)
RDW: 12.8 % (ref 11.5–15.5)
WBC: 6.7 10*3/uL (ref 4.0–10.5)

## 2016-03-15 LAB — DIFFERENTIAL
BASOS ABS: 0 10*3/uL (ref 0.0–0.1)
Basophils Relative: 0 %
EOS ABS: 0.1 10*3/uL (ref 0.0–0.7)
Eosinophils Relative: 1 %
LYMPHS ABS: 2.7 10*3/uL (ref 0.7–4.0)
LYMPHS PCT: 40 %
MONOS PCT: 6 %
Monocytes Absolute: 0.4 10*3/uL (ref 0.1–1.0)
Neutro Abs: 3.5 10*3/uL (ref 1.7–7.7)
Neutrophils Relative %: 53 %

## 2016-03-15 LAB — I-STAT CHEM 8, ED
BUN: 19 mg/dL (ref 6–20)
CHLORIDE: 105 mmol/L (ref 101–111)
CREATININE: 0.6 mg/dL (ref 0.44–1.00)
Calcium, Ion: 1.18 mmol/L (ref 1.15–1.40)
GLUCOSE: 128 mg/dL — AB (ref 65–99)
HEMATOCRIT: 37 % (ref 36.0–46.0)
Hemoglobin: 12.6 g/dL (ref 12.0–15.0)
POTASSIUM: 3.9 mmol/L (ref 3.5–5.1)
Sodium: 142 mmol/L (ref 135–145)
TCO2: 29 mmol/L (ref 0–100)

## 2016-03-15 LAB — COMPREHENSIVE METABOLIC PANEL
ALK PHOS: 52 U/L (ref 38–126)
ALT: 13 U/L — ABNORMAL LOW (ref 14–54)
AST: 20 U/L (ref 15–41)
Albumin: 3.8 g/dL (ref 3.5–5.0)
Anion gap: 11 (ref 5–15)
BILIRUBIN TOTAL: 0.5 mg/dL (ref 0.3–1.2)
BUN: 13 mg/dL (ref 6–20)
CALCIUM: 9.1 mg/dL (ref 8.9–10.3)
CO2: 25 mmol/L (ref 22–32)
CREATININE: 0.68 mg/dL (ref 0.44–1.00)
Chloride: 104 mmol/L (ref 101–111)
GFR calc Af Amer: >60 mL/min (ref >60)
Glucose, Bld: 123 mg/dL — ABNORMAL HIGH (ref 65–99)
POTASSIUM: 3.5 mmol/L (ref 3.5–5.1)
Sodium: 140 mmol/L (ref 135–145)
TOTAL PROTEIN: 6.6 g/dL (ref 6.5–8.1)

## 2016-03-15 LAB — PROTIME-INR
INR: 1.13
Prothrombin Time: 14.6 seconds (ref 11.4–15.2)

## 2016-03-15 LAB — CBG MONITORING, ED: Glucose-Capillary: 115 mg/dL — ABNORMAL HIGH (ref 65–99)

## 2016-03-15 LAB — APTT: aPTT: 31 seconds (ref 24–36)

## 2016-03-15 LAB — I-STAT TROPONIN, ED: TROPONIN I, POC: 0 ng/mL (ref 0.00–0.08)

## 2016-03-15 MED ORDER — PREDNISONE 20 MG PO TABS: 40.0000 mg | ORAL_TABLET | Freq: Every day | ORAL | 0 refills | Status: DC

## 2016-03-15 MED ORDER — METOCLOPRAMIDE HCL 5 MG/ML IJ SOLN
10.0000 mg | Freq: Once | INTRAMUSCULAR | Status: AC
  Administered 2016-03-15: 10 mg via INTRAVENOUS
  Filled 2016-03-15: qty 2

## 2016-03-15 MED ORDER — GADOBENATE DIMEGLUMINE 529 MG/ML IV SOLN
15.0000 mL | Freq: Once | INTRAVENOUS | Status: AC | PRN
  Administered 2016-03-15: 15 mL via INTRAVENOUS

## 2016-03-15 NOTE — ED Notes (Signed)
Patient transported to MRI 

## 2016-03-15 NOTE — Patient Instructions (Signed)
     IF you received an x-ray today, you will receive an invoice from [facility]. Please contact [facility] at [phone] with questions or concerns regarding your invoice.   IF you received labwork today, you will receive an invoice from [facility]. Please contact [facility] at [phone] with questions or concerns regarding your invoice.   Our billing staff will not be able to assist you with questions regarding bills from these companies.  You will be contacted with the lab results as soon as they are available. The fastest way to get your results is to activate your My Chart account. Instructions are located on the last page of this paperwork. If you have not heard from us regarding the results in 2 weeks, please contact this office.     

## 2016-03-15 NOTE — Progress Notes (Signed)
03/15/2016 3:44 PM   DOB: 1950/11/09 / MRN: [id number]  SUBJECTIVE:  Rachel [name] is a 66 y.o. post menopausal female with a history of HTN presenting for right sided "toothache" like occipiatl HA that radiation to the parietal bone. The HA started this morning. She also complains of some neck pain on the right sided that goes to the trapezius. Tried a warm compress and massage without relief. She has not tried any medication.  She denies a history of facial drop.  She did drive here. Her husband is at work and he is a Civil engineer, contracting for [employer]. She did take some 81 mg ASA at 9 am.   She has No Known Allergies.   She  has a past medical history of GERD (gastroesophageal reflux disease); Herpes; and Hypertension.    She  reports that she has never smoked. She has never used smokeless tobacco. She reports that she does not drink alcohol or use drugs. She  has no sexual activity history on file. The patient  has a past surgical history that includes Abdominal hysterectomy and Colonoscopy.  Her family history includes Cancer in her father and mother; Colon cancer in her maternal grandmother; Heart disease in her father and mother; Stroke in her mother.  Review of Systems  Constitutional: Negative for fever.  Eyes: Negative for blurred vision.  Gastrointestinal: Negative for abdominal pain, nausea and vomiting.  Genitourinary: Negative for dysuria, frequency and urgency.  Skin: Negative for rash.  Neurological: Positive for focal weakness and headaches. Negative for dizziness, tingling, seizures and loss of consciousness.    The problem list and medications were reviewed and updated by myself where necessary and exist elsewhere in the encounter.   OBJECTIVE:  BP 125/67   Pulse 67   Temp 98.3 F (36.8 C) (Oral)   Resp 16   Ht 5\' 1"  (1.549 m)   Wt 146 lb (66.2 kg)   LMP 08/01/2012   SpO2 99%   BMI 27.59 kg/m   Physical Exam  Constitutional: She is oriented to person,  place, and time.  Cardiovascular: Normal rate and regular rhythm.   Pulmonary/Chest: Effort normal and breath sounds normal.  Musculoskeletal: Normal range of motion.  Neurological: She is alert and oriented to person, place, and time. She displays no tremor and normal reflexes. No cranial nerve deficit or sensory deficit. She exhibits normal muscle tone. She displays a negative Romberg sign. Coordination normal.  Reflex Scores:      Tricep reflexes are 2+ on the right side and 2+ on the left side.      Bicep reflexes are 2+ on the right side and 2+ on the left side.      Brachioradialis reflexes are 2+ on the right side and 2+ on the left side.      Patellar reflexes are 2+ on the right side and 2+ on the left side.      Achilles reflexes are 2+ on the right side and 2+ on the left side. Right sided facial droop with forehead sparring.   Skin: Skin is warm and dry.  Psychiatric: Judgment normal. Her mood appears anxious. Her speech is not rapid and/or pressured. Cognition and memory are normal.    Lab Results  Component Value Date   CHOL 159 02/17/2015   HDL 98 02/17/2015   LDLCALC 55 02/17/2015   TRIG 30 02/17/2015   CHOLHDL 1.6 02/17/2015    No results found for this or any previous visit (from the past  72 hour(s)).  No results found.  ASSESSMENT AND PLAN:  Rachel [name] was seen today for headache.  Diagnoses and all orders for this visit:  Nonintractable headache, unspecified chronicity pattern, unspecified headache type; See problem two.  Facial droop: I am concerned she may be having a CVA given this is new and her forehead is sparred.  She did take an 81 mg ASA today. No other weakness or spasticity identified in her exam.  Will get her over to [facility] for a more thorough assessment and neuroimaging.      The patient is advised to call or return to clinic if she does not see an improvement in symptoms, or to seek the care of the closest emergency department if she worsens with  the above plan.   Philis Fendt, MHS, PA-C [facility] 03/15/2016 3:44 PM

## 2016-03-15 NOTE — ED Notes (Signed)
Pt's CBG result was 115. Informed Aaron Edelman - RN.

## 2016-03-15 NOTE — ED Notes (Signed)
Patient transported to CT 

## 2016-03-15 NOTE — ED Provider Notes (Signed)
[facility] DEPT Provider Note   CSN: [id number] Arrival date & time: 03/15/16  1623     History   Chief Complaint Chief Complaint  Patient presents with  . Headache  . Facial Droop    HPI Rachel [name] is a 66 y.o. female.  Patient is a 66 year old female with a history of hypertension, GERD presenting today with a headache that started around 8 AM this morning on the right side of her head. She states the pain initially started in the right side of her neck and moved to the right side of her head. It seemed to be worse with leaning over. Around the headache was not improving so she decided to go to urgent care. Urgent care they noted a mild right-sided facial droop and some numbness and she was sent here for further evaluation. Patient states she did not look in the near so had not noticed that her face was drooping but when she was eating lunch around noon she thought she might have bit her lip because it felt slightly unusual. She denies any symptoms in the right arm or leg. She's had no speech or swallowing difficulty. She denies any vision changes. No prior history of stroke. She denies any chest pain or shortness of breath. No recent medication changes. Currently she states the headache is coming and going but is not constant. She does not have any pain in her neck at this time. She denies any recent hospitalizations, illness, medication changes, fever, cough.   The history is provided by the patient.  Headache      Past Medical History:  Diagnosis Date  . GERD (gastroesophageal reflux disease)   . Herpes   . Hypertension     Patient Active Problem List   Diagnosis Date Noted  . Esophageal reflux 02/17/2015  . Essential hypertension 11/30/2012    Past Surgical History:  Procedure Laterality Date  . ABDOMINAL HYSTERECTOMY    . COLONOSCOPY      OB History    No data available       Home Medications    Prior to Admission medications   Medication Sig  Start Date End Date Taking? Authorizing Provider  acyclovir (ZOVIRAX) 400 MG tablet Take 800 mg by mouth daily.    Historical Provider, MD  amLODipine (NORVASC) 5 MG tablet Take 1 tablet (5 mg total) by mouth daily. No more refills without office visit 10/17/15   Sedalia Muta, FNP  aspirin 81 MG chewable tablet Chew by mouth daily.    Historical Provider, MD  estrogens, conjugated, (PREMARIN) 0.3 MG tablet Take 0.3 mg by mouth daily.    Historical Provider, MD  Iron-Vitamins (GERITOL PO) Take by mouth daily.    Historical Provider, MD  Multiple Vitamin (MULTIVITAMIN WITH MINERALS) TABS tablet Take 1 tablet by mouth daily.    Historical Provider, MD  omeprazole (PRILOSEC) 20 MG capsule Take 20 mg by mouth daily.    Historical Provider, MD  ranitidine (ZANTAC) 150 MG tablet TAKE 1 TABLET (150 MG TOTAL) BY MOUTH AT BEDTIME. 11/07/15   Wardell Honour, MD    Family History Family History  Problem Relation Age of Onset  . Cancer Mother   . Stroke Mother   . Heart disease Mother   . Heart disease Father   . Cancer Father     prostate cancer  . Colon cancer Maternal Grandmother     pt unsure, MGM might have had colon CA  . Esophageal  cancer Neg Hx   . Stomach cancer Neg Hx   . Rectal cancer Neg Hx     Social History Social History  Substance Use Topics  . Smoking status: Never Smoker  . Smokeless tobacco: Never Used  . Alcohol use No     Allergies   Patient has no known allergies.   Review of Systems Review of Systems  Neurological: Positive for headaches.  All other systems reviewed and are negative.    Physical Exam Updated Vital Signs BP 160/69   Pulse 68   Resp 16   Ht 5\' 2"  (1.575 m)   Wt 146 lb (66.2 kg)   LMP 08/01/2012   SpO2 100%   BMI 26.70 kg/m   Physical Exam  Constitutional: She is oriented to person, place, and time. She appears well-developed and well-nourished. No distress.  HENT:  Head: Normocephalic and atraumatic.  Mouth/Throat:  Oropharynx is clear and moist.  Eyes: Conjunctivae and EOM are normal. Pupils are equal, round, and reactive to light.  Neck: Normal range of motion. Neck supple.  No bruits noted and no tenderness with palpation over the carotids or the back of the neck  Cardiovascular: Normal rate, regular rhythm and intact distal pulses.   No murmur heard. Pulmonary/Chest: Effort normal and breath sounds normal. No respiratory distress. She has no wheezes. She has no rales.  Abdominal: Soft. She exhibits no distension. There is no tenderness. There is no rebound and no guarding.  Musculoskeletal: Normal range of motion. She exhibits no edema or tenderness.  Neurological: She is alert and oriented to person, place, and time. She has normal strength. She displays a negative Romberg sign. Coordination and gait normal.  Mild lower right-sided facial droop noted with smiling and mild drooping of the right eyelid. Mild decrease sensation and sharp touch to the right side of the face. Forehead seems to be spared.  No visual field cuts. Bilateral upper and lower extremities same sensation in 5 out of 5 strength.  Skin: Skin is warm and dry. No rash noted. No erythema.  No vesicular lesions present on the skin  Psychiatric: She has a normal mood and affect. Her behavior is normal.  Nursing note and vitals reviewed.    ED Treatments / Results  Labs (all labs ordered are listed, but only abnormal results are displayed) Labs Reviewed  CBC - Abnormal; Notable for the following:       Result Value   Hemoglobin 11.4 (*)    HCT 35.8 (*)    All other components within normal limits  COMPREHENSIVE METABOLIC PANEL - Abnormal; Notable for the following:    Glucose, Bld 123 (*)    ALT 13 (*)    All other components within normal limits  CBG MONITORING, ED - Abnormal; Notable for the following:    Glucose-Capillary 115 (*)    All other components within normal limits  I-STAT CHEM 8, ED - Abnormal; Notable for the  following:    Glucose, Bld 128 (*)    All other components within normal limits  PROTIME-INR  APTT  DIFFERENTIAL  I-STAT TROPOININ, ED    EKG  EKG Interpretation  Date/Time:  Monday March 15 2016 16:31:05 EST Ventricular Rate:  64 PR Interval:    QRS Duration: 94 QT Interval:  422 QTC Calculation: 436 R Axis:   6 Text Interpretation:  Sinus rhythm Low voltage, precordial leads LVH by voltage No significant change since last tracing Confirmed by Maryan Rued  MD, Shifa Brisbon ([id number]) on  03/15/2016 5:22:35 PM       Radiology Ct Head Wo Contrast  Result Date: 03/15/2016 CLINICAL DATA:  Headache, LEFT-sided facial droop, last seen normal at 0 800 hours, stroke symptoms, history hypertension, GERD EXAM: CT HEAD WITHOUT CONTRAST TECHNIQUE: Contiguous axial images were obtained from the base of the skull through the vertex without intravenous contrast. Sagittal and coronal MPR images reconstructed from axial data set. COMPARISON:  None; correlation MR brain 08/22/2008 FINDINGS: Brain: Normal ventricular morphology. No midline shift or mass effect. Normal appearance of brain parenchyma for age. No intracranial hemorrhage, mass lesion, evidence of acute infarction, or extra-axial fluid collection. Vascular: Unremarkable Skull: Intact Sinuses/Orbits: Clear Other: N/A IMPRESSION: Normal exam. Electronically Signed   By: Lavonia Dana M.D.   On: 03/15/2016 17:13   Mr Jodene Nam Head Wo Contrast  Result Date: 03/15/2016 CLINICAL DATA:  Right-sided headache and right-sided neck pain. Right facial droop. EXAM: MRI HEAD WITHOUT CONTRAST MRA HEAD WITHOUT CONTRAST MRA NECK WITHOUT AND WITH CONTRAST TECHNIQUE: Multiplanar, multiecho pulse sequences of the brain and surrounding structures were obtained without intravenous contrast. Angiographic images of the Circle of Willis were obtained using MRA technique without intravenous contrast. Angiographic images of the neck were obtained using MRA technique without and with  intravenous contrast. Carotid stenosis measurements (when applicable) are obtained utilizing NASCET criteria, using the distal internal carotid diameter as the denominator. CONTRAST:  61mL MULTIHANCE GADOBENATE DIMEGLUMINE 529 MG/ML IV SOLN COMPARISON:  Head CT 03/15/2016 and MRI 08/22/2008 FINDINGS: MRI HEAD FINDINGS Brain: There is no evidence of acute infarct, intracranial hemorrhage, mass, midline shift, or extra-axial fluid collection. The ventricles and sulci are normal. Small amount of confluent T2 hyperintensity around the frontal horn of the right lateral ventricle and a few punctate foci of subcortical T2 hyperintensity elsewhere do not appear significantly changed from the prior MRI. 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 Vascular: Major intracranial vascular flow voids are preserved. Skull and upper cervical spine: Unremarkable bone marrow signal. Sinuses/Orbits: Prior bilateral cataract extraction. No significant inflammatory disease in the paranasal sinuses or mastoid air cells. Other: None. MRA HEAD FINDINGS The visualized distal vertebral arteries are widely patent with the right being slightly larger than the left. PICA and SCA origins are patent. Basilar artery is widely patent. There patent posterior communicating arteries bilaterally. PCAs are patent without evidence of significant stenosis. The internal carotid arteries are widely patent from skullbase to carotid termini. There is a 1.5 cm outpouching at the right ophthalmic artery origin favored to reflect an infundibulum over aneurysm given its shape and the fact that the artery appears to arise from the apex of the outpouching. ACAs and MCAs are patent without evidence of major branch occlusion or significant stenosis. There is a 1.5 mm outpouching projecting superiorly from the mid left M1 segment with a possible vessel arising from it. MRA NECK FINDINGS Standard 3 vessel aortic arch. Brachiocephalic and subclavian arteries are widely  patent. The cervical carotid arteries are widely patent without evidence of stenosis or dissection. The vertebral arteries are patent with antegrade flow bilaterally. There is no evidence of significant vertebral artery stenosis for dissection. IMPRESSION: 1. No acute intracranial abnormality. 2. Minimal cerebral white matter T2 signal changes, stable from 2010 and nonspecific. 3. No major vessel occlusion or significant stenosis in the intracranial or cervical arterial circulation. 4. 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 Electronically Signed   By: Seymour Bars.D.  On: 03/15/2016 20:07   Mr Angiogram Neck W Or Wo Contrast  Result Date: 03/15/2016 CLINICAL DATA:  Right-sided headache and right-sided neck pain. Right facial droop. EXAM: MRI HEAD WITHOUT CONTRAST MRA HEAD WITHOUT CONTRAST MRA NECK WITHOUT AND WITH CONTRAST TECHNIQUE: Multiplanar, multiecho pulse sequences of the brain and surrounding structures were obtained without intravenous contrast. Angiographic images of the Circle of Willis were obtained using MRA technique without intravenous contrast. Angiographic images of the neck were obtained using MRA technique without and with intravenous contrast. Carotid stenosis measurements (when applicable) are obtained utilizing NASCET criteria, using the distal internal carotid diameter as the denominator. CONTRAST:  71mL MULTIHANCE GADOBENATE DIMEGLUMINE 529 MG/ML IV SOLN COMPARISON:  Head CT 03/15/2016 and MRI 08/22/2008 FINDINGS: MRI HEAD FINDINGS Brain: There is no evidence of acute infarct, intracranial hemorrhage, mass, midline shift, or extra-axial fluid collection. The ventricles and sulci are normal. Small amount of confluent T2 hyperintensity around the frontal horn of the right lateral ventricle and a few punctate foci of subcortical T2 hyperintensity elsewhere do not appear significantly changed from the prior MRI. A partially  empty sella is again noted. Vascular: Major intracranial vascular flow voids are preserved. Skull and upper cervical spine: Unremarkable bone marrow signal. Sinuses/Orbits: Prior bilateral cataract extraction. No significant inflammatory disease in the paranasal sinuses or mastoid air cells. Other: None. MRA HEAD FINDINGS The visualized distal vertebral arteries are widely patent with the right being slightly larger than the left. PICA and SCA origins are patent. Basilar artery is widely patent. There patent posterior communicating arteries bilaterally. PCAs are patent without evidence of significant stenosis. The internal carotid arteries are widely patent from skullbase to carotid termini. There is a 1.5 cm outpouching at the right ophthalmic artery origin favored to reflect an infundibulum over aneurysm given its shape and the fact that the artery appears to arise from the apex of the outpouching. ACAs and MCAs are patent without evidence of major branch occlusion or significant stenosis. There is a 1.5 mm outpouching projecting superiorly from the mid left M1 segment with a possible vessel arising from it. MRA NECK FINDINGS Standard 3 vessel aortic arch. Brachiocephalic and subclavian arteries are widely patent. The cervical carotid arteries are widely patent without evidence of stenosis or dissection. The vertebral arteries are patent with antegrade flow bilaterally. There is no evidence of significant vertebral artery stenosis for dissection. IMPRESSION: 1. No acute intracranial abnormality. 2. Minimal cerebral white matter T2 signal changes, stable from 2010 and nonspecific. 3. No major vessel occlusion or significant stenosis in the intracranial or cervical arterial circulation. 4. 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 Electronically Signed   By: Logan Bores M.D.   On: 03/15/2016 20:07   Mr Brain Wo Contrast  Result Date:  03/15/2016 CLINICAL DATA:  Right-sided headache and right-sided neck pain. Right facial droop. EXAM: MRI HEAD WITHOUT CONTRAST MRA HEAD WITHOUT CONTRAST MRA NECK WITHOUT AND WITH CONTRAST TECHNIQUE: Multiplanar, multiecho pulse sequences of the brain and surrounding structures were obtained without intravenous contrast. Angiographic images of the Circle of Willis were obtained using MRA technique without intravenous contrast. Angiographic images of the neck were obtained using MRA technique without and with intravenous contrast. Carotid stenosis measurements (when applicable) are obtained utilizing NASCET criteria, using the distal internal carotid diameter as the denominator. CONTRAST:  23mL MULTIHANCE GADOBENATE DIMEGLUMINE 529 MG/ML IV SOLN COMPARISON:  Head CT 03/15/2016 and MRI 08/22/2008 FINDINGS: MRI HEAD FINDINGS Brain:  There is no evidence of acute infarct, intracranial hemorrhage, mass, midline shift, or extra-axial fluid collection. The ventricles and sulci are normal. Small amount of confluent T2 hyperintensity around the frontal horn of the right lateral ventricle and a few punctate foci of subcortical T2 hyperintensity elsewhere do not appear significantly changed from the prior MRI. 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 Vascular: Major intracranial vascular flow voids are preserved. Skull and upper cervical spine: Unremarkable bone marrow signal. Sinuses/Orbits: Prior bilateral cataract extraction. No significant inflammatory disease in the paranasal sinuses or mastoid air cells. Other: None. MRA HEAD FINDINGS The visualized distal vertebral arteries are widely patent with the right being slightly larger than the left. PICA and SCA origins are patent. Basilar artery is widely patent. There patent posterior communicating arteries bilaterally. PCAs are patent without evidence of significant stenosis. The internal carotid arteries are widely patent from skullbase to carotid termini. There is a 1.5 cm  outpouching at the right ophthalmic artery origin favored to reflect an infundibulum over aneurysm given its shape and the fact that the artery appears to arise from the apex of the outpouching. ACAs and MCAs are patent without evidence of major branch occlusion or significant stenosis. There is a 1.5 mm outpouching projecting superiorly from the mid left M1 segment with a possible vessel arising from it. MRA NECK FINDINGS Standard 3 vessel aortic arch. Brachiocephalic and subclavian arteries are widely patent. The cervical carotid arteries are widely patent without evidence of stenosis or dissection. The vertebral arteries are patent with antegrade flow bilaterally. There is no evidence of significant vertebral artery stenosis for dissection. IMPRESSION: 1. No acute intracranial abnormality. 2. Minimal cerebral white matter T2 signal changes, stable from 2010 and nonspecific. 3. No major vessel occlusion or significant stenosis in the intracranial or cervical arterial circulation. 4. 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 Electronically Signed   By: Logan Bores M.D.   On: 03/15/2016 20:07    Procedures Procedures (including critical care time)  Medications Ordered in ED Medications  metoCLOPramide (REGLAN) injection 10 mg (10 mg Intravenous Given 03/15/16 1737)     Initial Impression / Assessment and Plan / ED Course  I have reviewed the triage vital signs and the nursing notes.  Pertinent labs & imaging results that were available during my care of the patient were reviewed by me and considered in my medical decision making (see chart for details).    Patient is a 66 year old female who is relatively healthy presenting today with right-sided head and neck pain with some minor right facial droop in numbness. It seems to spare the forehead without other symptoms at this time.  Concern for potential stroke as this is not classic Bell's  palsy. Also with the headache concerning for complicated migraine. Patient did have a head injury approximately 10 years ago and does get intermittent headaches but is never had these symptoms with the headache. Also concern for  vertebral dissection as she did have pain going down the back of her neck before it started in her head.  CT within normal limits, labs within normal limits. Vital signs within normal limits. Patient given Reglan to help with the headache which is now starting to improve but the numbness in her face is not. That an MRI/MRA of the head and neck to further evaluate for the above concerns.  8:52 PM MRI without acute findings.  Spoke with Dr. Lenoria Chime who had no  further concerns.  When re-evaluated the pt she does have mild droop in the right side of her face which she notes when looking in the mirror but no other sx.  She takes acyclovir daily.  Will give 5 days of prednisone for possible early bell's palsy Final Clinical Impressions(s) / ED Diagnoses   Final diagnoses:  Facial droop  Acute intractable headache, unspecified headache type    New Prescriptions New Prescriptions   PREDNISONE (DELTASONE) 20 MG TABLET    Take 2 tablets (40 mg total) by mouth daily.     Blanchie Dessert, MD 03/15/16 2111

## 2016-03-15 NOTE — ED Triage Notes (Signed)
Per [facility], pt was at urgent care today for c/o R sided HA and doc noticed R side facial droop at 1530. Per EMS, HA began at 0800, pt denies nausea or vision changes. Pt arrives able to MAE's equally, R face droop present. CBG was 138, VSS, speech clear, a&ox4

## 2016-03-15 NOTE — ED Notes (Signed)
Patient returned from MRI.

## 2016-03-15 NOTE — ED Notes (Signed)
Patient Alert and oriented X4. Stable and ambulatory. Patient verbalized understanding of the discharge instructions.  Patient belongings were taken by the patient.  

## 2016-03-19 ENCOUNTER — Encounter: Payer: Self-pay | Admitting: Physician Assistant

## 2016-03-19 ENCOUNTER — Ambulatory Visit (INDEPENDENT_AMBULATORY_CARE_PROVIDER_SITE_OTHER): Payer: Managed Care, Other (non HMO) | Admitting: Physician Assistant

## 2016-03-19 VITALS — BP 136/74 | HR 58 | Temp 98.4°F | Resp 16 | Ht 62.0 in | Wt 146.6 lb

## 2016-03-19 DIAGNOSIS — G510 Bell's palsy: Secondary | ICD-10-CM | POA: Diagnosis not present

## 2016-03-19 DIAGNOSIS — S46812A Strain of other muscles, fascia and tendons at shoulder and upper arm level, left arm, initial encounter: Secondary | ICD-10-CM | POA: Diagnosis not present

## 2016-03-19 DIAGNOSIS — R12 Heartburn: Secondary | ICD-10-CM

## 2016-03-19 NOTE — Progress Notes (Signed)
Rachel [name]  MRN: [id number] DOB: [birth year]  Subjective:  Pt presents to clinic for hospital follow-up.  She was diagnosed with Bells palsy - she was seen on 2/26 on our office and sent to the ED for possible stroke at the hospital her imaging did not show signs of a stroke and she was treated for Bell's Palsy.  She is feeling good at this time but was told to f/u with her PCP.  She tried to stop her heartburn medication but when she stopped her prilosec she got terrible heartburn - she wants to makes sure she can take both the zantac and the prilosec at the same time because that combination seems to treat her symptoms.  Review of Systems  Constitutional: Negative for chills and fever.  Neurological: Negative for weakness and headaches.    Patient Active Problem List   Diagnosis Date Noted  . Esophageal reflux 02/17/2015  . Essential hypertension 11/30/2012    Current Outpatient Prescriptions on File Prior to Visit  Medication Sig Dispense Refill  . acyclovir (ZOVIRAX) 400 MG tablet Take 800 mg by mouth daily.    Marland Kitchen amLODipine (NORVASC) 5 MG tablet Take 1 tablet (5 mg total) by mouth daily. No more refills without office visit 30 tablet 6  . aspirin 81 MG chewable tablet Chew by mouth daily.    Marland Kitchen estrogens, conjugated, (PREMARIN) 0.3 MG tablet Take 0.3 mg by mouth daily.    . Iron-Vitamins (GERITOL PO) Take by mouth daily.    . Multiple Vitamin (MULTIVITAMIN WITH MINERALS) TABS tablet Take 1 tablet by mouth daily.    Marland Kitchen omeprazole (PRILOSEC) 20 MG capsule Take 20 mg by mouth daily.    . predniSONE (DELTASONE) 20 MG tablet Take 2 tablets (40 mg total) by mouth daily. 10 tablet 0  . ranitidine (ZANTAC) 150 MG tablet TAKE 1 TABLET (150 MG TOTAL) BY MOUTH AT BEDTIME. (Patient not taking: Reported on 03/19/2016) 30 tablet 0   No current facility-administered medications on file prior to visit.     No Known Allergies  Pt patients past, family and social history were reviewed  and updated.   Objective:  BP 136/74   Pulse (!) 58   Temp 98.4 F (36.9 C) (Oral)   Resp 16   Ht 5\' 2"  (1.575 m)   Wt 146 lb 9.6 oz (66.5 kg)   LMP 08/01/2012   SpO2 99%   BMI 26.81 kg/m   Physical Exam  Constitutional: She is oriented to person, place, and time and well-developed, well-nourished, and in no distress.  HENT:  Head: Normocephalic and atraumatic.  Right Ear: Hearing and external ear normal.  Left Ear: Hearing and external ear normal.  Right side mouth droop without changes to the nasolabial folds or forehead.  Eyes: Conjunctivae are normal.  Neck: Normal range of motion.  Pulmonary/Chest: Effort normal.  Musculoskeletal:       Cervical back: Normal.  Mild TTP over left trapezius muscle.  Neurological: She is alert and oriented to person, place, and time. Gait normal.  Skin: Skin is warm and dry.  Psychiatric: Mood, memory, affect and judgment normal.  Vitals reviewed.   Assessment and Plan :  Bell's palsy - continue current medication given in the ED  Heartburn - d/w pt medications to use and how to taper when she wants to try  Strain of left trapezius muscle, initial encounter - ok to use tylenol and heat to the area - massage can also be helpful  Windell Hummingbird PA-C  [facility] 03/19/2016 10:39 AM

## 2016-03-19 NOTE — Patient Instructions (Addendum)
zantac 150mg  1 pill 2x/day  Prilosec 20mg  daily - when you want to stop it to go to every other day and then every 3rd day each for a week but continue the Zantac  Finish all the medications for your Bell's Palsy.  F/u with [place] if you are having problems.  IF you received an x-ray today, you will receive an invoice from [facility]. Please contact [facility] at [phone] with questions or concerns regarding your invoice.   IF you received labwork today, you will receive an invoice from [facility]. Please contact [facility] at [phone] with questions or concerns regarding your invoice.   Our billing staff will not be able to assist you with questions regarding bills from these companies.  You will be contacted with the lab results as soon as they are available. The fastest way to get your results is to activate your My Chart account. Instructions are located on the last page of this paperwork. If you have not heard from [place] regarding the results in 2 weeks, please contact this office.

## 2016-03-26 ENCOUNTER — Ambulatory Visit (INDEPENDENT_AMBULATORY_CARE_PROVIDER_SITE_OTHER): Payer: Managed Care, Other (non HMO) | Admitting: Physician Assistant

## 2016-03-26 VITALS — BP 125/70 | HR 68 | Temp 97.4°F | Resp 18 | Ht 62.0 in | Wt 147.0 lb

## 2016-03-26 DIAGNOSIS — M62830 Muscle spasm of back: Secondary | ICD-10-CM | POA: Diagnosis not present

## 2016-03-26 DIAGNOSIS — M940 Chondrocostal junction syndrome [Tietze]: Secondary | ICD-10-CM

## 2016-03-26 MED ORDER — MELOXICAM 7.5 MG PO TABS: 7.5000 mg | ORAL_TABLET | Freq: Two times a day (BID) | ORAL | 0 refills | Status: AC

## 2016-03-26 MED ORDER — CYCLOBENZAPRINE HCL 5 MG PO TABS: 5.0000 mg | ORAL_TABLET | Freq: Every day | ORAL | 0 refills | Status: AC

## 2016-03-26 NOTE — Progress Notes (Signed)
Patient ID: Rachel [name], female    DOB: 02-13-1950, 66 y.o.   MRN: [id number]  PCP: Kathlen Brunswick, PA-C  Chief Complaint  Patient presents with  . Back Pain    upper back pain off/on   . Flank Pain    on left side under left breast; causing shortness of breath       Presents for evaluation of pain under rib and upper back muscle pain. Her rib pain has been off and on for about a month. Unsure of any aggravation factors or relieving factors. Has tried nothing to relive it. Comes and goes but when it does come its a dull ache. It can be alittle worse with a dep breath but not much. The back pain she ha noticed since her Bell's palsy dx 03/19/2016 . She has been feeling the pain in her upper back above her bra line. She used a massage to help with the pain and she felt better. If she is having trouble falling asleep due to the pain she uses her "night pill." She states "my night pill puts me too sleep" (I asked Zantac) she said "yes" after confirming her medications with her.  Denies changing in urine, burning, increased frequently, fever, chills, malaise.  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   Review of Systems  As written above   Patient Active Problem List   Diagnosis Date Noted  . Esophageal reflux 02/17/2015  . Essential hypertension 11/30/2012    Prior to Admission medications   Medication Sig Start Date End Date Taking? Authorizing Provider  acyclovir (ZOVIRAX) 400 MG tablet Take 800 mg by mouth daily.   Yes Historical Provider, MD  amLODipine (NORVASC) 5 MG tablet Take 1 tablet (5 mg total) by mouth daily. No more refills without office visit 10/17/15  Yes Sedalia Muta, FNP  aspirin 81 MG chewable tablet Chew by mouth daily.   Yes Historical Provider, MD  estrogens, conjugated, (PREMARIN) 0.3 MG tablet Take 0.3 mg by mouth daily.   Yes Historical Provider, MD  Iron-Vitamins (GERITOL PO) Take by mouth daily.    Yes Historical Provider, MD  Multiple Vitamin (MULTIVITAMIN WITH MINERALS) TABS tablet Take 1 tablet by mouth daily.   Yes Historical Provider, MD  omeprazole (PRILOSEC) 20 MG capsule Take 20 mg by mouth daily.   Yes Historical Provider, MD  predniSONE (DELTASONE) 20 MG tablet Take 2 tablets (40 mg total) by mouth daily. 03/15/16  Yes Blanchie Dessert, MD  ranitidine (ZANTAC) 150 MG tablet TAKE 1 TABLET (150 MG TOTAL) BY MOUTH AT BEDTIME. 11/07/15  Yes Wardell Honour, MD     No Known Allergies     Objective:  Physical Exam  Constitutional: She is oriented to person, place, and time. She appears well-developed and well-nourished.  Blood pressure 125/70, pulse 68, temperature 97.4 F (36.3 C), temperature source Oral, resp. rate 18, height 5\' 2"  (1.575 m), weight 147 lb (66.7 kg), last menstrual period 08/01/2012, SpO2 99 %.  HENT:  Head: Normocephalic.  Eyes: Pupils are equal, round, and reactive to light.  Neck: Normal range of motion. Neck supple.  Cardiovascular: Normal rate, regular rhythm and normal heart sounds.   Pulmonary/Chest: Effort normal and breath sounds normal. She exhibits tenderness. She exhibits no crepitus, no edema, no deformity and no swelling.    Abdominal: Soft.  Musculoskeletal:       Back:  Neurological: She is alert and oriented to person,  place, and time.  Skin: Skin is warm and dry.  Psychiatric: She has a normal mood and affect. Her speech is normal and behavior is normal. Judgment and thought content normal. Cognition and memory are normal.       Assessment & Plan:  1. Muscle spasm of back - cyclobenzaprine (FLEXERIL) 5 MG tablet; Take 1 tablet (5 mg total) by mouth at bedtime.  Dispense: 14 tablet; Refill: 0 - meloxicam (MOBIC) 7.5 MG tablet; Take 1 tablet (7.5 mg total) by mouth 2 (two) times daily with a meal.  Dispense: 28 tablet; Refill: 0  2. Costochondritis - cyclobenzaprine (FLEXERIL) 5 MG tablet; Take 1 tablet (5 mg total) by mouth at  bedtime.  Dispense: 14 tablet; Refill: 0 - meloxicam (MOBIC) 7.5 MG tablet; Take 1 tablet (7.5 mg total) by mouth 2 (two) times daily with a meal.  Dispense: 28 tablet; Refill: 0

## 2016-03-26 NOTE — Progress Notes (Signed)
Rachel [name]  MRN: [id number] DOB: 1950-10-27  PCP: Kathlen Brunswick, PA-C  Chief Complaint  Patient presents with  . Back Pain    upper back pain off/on   . Flank Pain    on left side under left breast; causing shortness of breath    Subjective:  Pt presents to clinic for evaluation of left sided lower rib pain that she has had intermittently over the last month.  She has a physical job but does not remember a specific incident that triggered this pain.  She is also having some discomfort in her left upper neck/shoulder area.  The pain is dull/ache and does not radiate and she is unsure if anything makes it worse or better.  Sometimes a deep breath wil make it worse but not always.  She does admit to being more aware of things since her Bell's palsy was diagnosed on 03/15/2016.  The pain does disrupt her sleep slightly.  Review of Systems  Constitutional: Negative for chills and fever.  Respiratory: Negative for cough.   Genitourinary: Negative.   Psychiatric/Behavioral: Negative for sleep disturbance.    Patient Active Problem List   Diagnosis Date Noted  . Esophageal reflux 02/17/2015  . Essential hypertension 11/30/2012    Current Outpatient Prescriptions on File Prior to Visit  Medication Sig Dispense Refill  . amLODipine (NORVASC) 5 MG tablet Take 1 tablet (5 mg total) by mouth daily. No more refills without office visit 30 tablet 6  . aspirin 81 MG chewable tablet Chew by mouth daily.    Marland Kitchen estrogens, conjugated, (PREMARIN) 0.3 MG tablet Take 0.3 mg by mouth daily.    . Iron-Vitamins (GERITOL PO) Take by mouth daily.    . Multiple Vitamin (MULTIVITAMIN WITH MINERALS) TABS tablet Take 1 tablet by mouth daily.    Marland Kitchen omeprazole (PRILOSEC) 20 MG capsule Take 20 mg by mouth daily.    . ranitidine (ZANTAC) 150 MG tablet TAKE 1 TABLET (150 MG TOTAL) BY MOUTH AT BEDTIME. 30 tablet 0   No current facility-administered medications on file prior to visit.     No Known  Allergies  Pt patients past, family and social history were reviewed and updated.   Objective:  BP 125/70   Pulse 68   Temp 97.4 F (36.3 C) (Oral)   Resp 18   Ht 5\' 2"  (1.575 m)   Wt 147 lb (66.7 kg)   LMP 08/01/2012   SpO2 99%   BMI 26.89 kg/m   Physical Exam  Constitutional: She is oriented to person, place, and time and well-developed, well-nourished, and in no distress.  HENT:  Head: Normocephalic and atraumatic.  Right Ear: Hearing and external ear normal.  Left Ear: Hearing and external ear normal.  Improved ability to smile in regards to her right sided Bell's palsey - only slight deficit  Eyes: Conjunctivae are normal.  Neck: Normal range of motion.  Cardiovascular: Normal rate, regular rhythm and normal heart sounds.   No murmur heard. Pulmonary/Chest: Effort normal and breath sounds normal. She has no wheezes.    Musculoskeletal:       Cervical back: She exhibits tenderness (over mucsle spasm) and spasm. She exhibits normal range of motion and no bony tenderness.       Back:  Neurological: She is alert and oriented to person, place, and time. Gait normal.  Skin: Skin is warm and dry.  Psychiatric: Mood, memory, affect and judgment normal.  Vitals reviewed.   Assessment and Plan :  Muscle spasm of back - Plan: cyclobenzaprine (FLEXERIL) 5 MG tablet, meloxicam (MOBIC) 7.5 MG tablet  Costochondritis - Plan: cyclobenzaprine (FLEXERIL) 5 MG tablet, meloxicam (MOBIC) 7.5 MG tablet  Reassurance given to patient - heat and massage will help - f/u if things do not improve.  D/w pt the possibility of the flexeril causing sedation she will just use at night as that is truly when the pain is bothering her the most.  Windell Hummingbird PA-C  [facility] [place] 04/01/2016 12:18 PM

## 2016-03-26 NOTE — Patient Instructions (Addendum)
     IF you received an x-ray today, you will receive an invoice from [facility]. Please contact [facility] at [phone] with questions or concerns regarding your invoice.   IF you received labwork today, you will receive an invoice from [place]. Please contact [facility] at [phone] with questions or concerns regarding your invoice.   Our billing staff will not be able to assist you with questions regarding bills from these companies.  You will be contacted with the lab results as soon as they are available. The fastest way to get your results is to activate your My Chart account. Instructions are located on the last page of this paperwork. If you have not heard from [place] regarding the results in 2 weeks, please contact this office.     Costochondritis Costochondritis is swelling and irritation (inflammation) of the tissue (cartilage) that connects your ribs to your breastbone (sternum). This causes pain in the front of your chest. Usually, the pain:  Starts gradually.  Is in more than one rib. This condition usually goes away on its own over time. Follow these instructions at home:  Do not do anything that makes your pain worse.  If directed, put ice on the painful area:  Put ice in a plastic bag.  Place a towel between your skin and the bag.  Leave the ice on for 20 minutes, 2-3 times a day.  If directed, put heat on the affected area as often as told by your doctor. Use the heat source that your doctor tells you to use, such as a moist heat pack or a heating pad.  Place a towel between your skin and the heat source.  Leave the heat on for 20-30 minutes.  Take off the heat if your skin turns bright red. This is very important if you cannot feel pain, heat, or cold. You may have a greater risk of getting burned.  Take over-the-counter and prescription medicines only as told by your doctor.  Return to your normal activities as told by your doctor.  Ask your doctor what activities are safe for you.  Keep all follow-up visits as told by your doctor. This is important. Contact a doctor if:  You have chills or a fever.  Your pain does not go away or it gets worse.  You have a cough that does not go away. Get help right away if:  You are short of breath. This information is not intended to replace advice given to you by your health care provider. Make sure you discuss any questions you have with your health care provider. Document Released: 06/23/2007 Document Revised: 07/25/2015 Document Reviewed: 04/30/2015 Elsevier Interactive Patient Education  2017 [employer].

## 2016-03-30 ENCOUNTER — Telehealth: Payer: Self-pay | Admitting: Physician Assistant

## 2016-03-30 NOTE — Telephone Encounter (Signed)
Patient need FMLA completed by Windell Hummingbird for her Bell's Palsy. I have completed what I could from the [place] notes and highlighted the areas that need to be completed. I will place them in your box on 03/30/16 please return them to the FMLA/Disability box at the 102 checkout desk within 5-7 business days. Thank you!

## 2016-04-02 NOTE — Telephone Encounter (Signed)
Done

## 2016-06-04 ENCOUNTER — Other Ambulatory Visit: Payer: Self-pay | Admitting: Family Medicine

## 2016-08-01 ENCOUNTER — Other Ambulatory Visit: Payer: Self-pay | Admitting: Physician Assistant

## 2016-08-02 NOTE — Telephone Encounter (Signed)
Gave 30 day supply.  Patient will need an follow-up visit for additional refills.

## 2016-09-02 ENCOUNTER — Other Ambulatory Visit: Payer: Self-pay | Admitting: Physician Assistant

## 2016-09-07 ENCOUNTER — Ambulatory Visit (INDEPENDENT_AMBULATORY_CARE_PROVIDER_SITE_OTHER): Payer: Managed Care, Other (non HMO) | Admitting: Family Medicine

## 2016-09-07 ENCOUNTER — Encounter: Payer: Self-pay | Admitting: Family Medicine

## 2016-09-07 VITALS — BP 132/67 | HR 58 | Temp 97.6°F | Resp 18 | Ht 61.81 in | Wt 145.4 lb

## 2016-09-07 DIAGNOSIS — Z79890 Hormone replacement therapy (postmenopausal): Secondary | ICD-10-CM | POA: Insufficient documentation

## 2016-09-07 DIAGNOSIS — I10 Essential (primary) hypertension: Secondary | ICD-10-CM | POA: Diagnosis not present

## 2016-09-07 DIAGNOSIS — Z833 Family history of diabetes mellitus: Secondary | ICD-10-CM | POA: Diagnosis not present

## 2016-09-07 DIAGNOSIS — M940 Chondrocostal junction syndrome [Tietze]: Secondary | ICD-10-CM

## 2016-09-07 DIAGNOSIS — Z7185 Encounter for immunization safety counseling: Secondary | ICD-10-CM

## 2016-09-07 DIAGNOSIS — Z7189 Other specified counseling: Secondary | ICD-10-CM | POA: Diagnosis not present

## 2016-09-07 MED ORDER — AMLODIPINE BESYLATE 5 MG PO TABS: 5.0000 mg | ORAL_TABLET | Freq: Every day | ORAL | 0 refills | Status: DC

## 2016-09-07 NOTE — Patient Instructions (Signed)
     IF you received an x-ray today, you will receive an invoice from [facility]. Please contact [facility] at [phone] with questions or concerns regarding your invoice.   IF you received labwork today, you will receive an invoice from [facility]. Please contact [facility] at [phone] with questions or concerns regarding your invoice.   Our billing staff will not be able to assist you with questions regarding bills from these companies.  You will be contacted with the lab results as soon as they are available. The fastest way to get your results is to activate your My Chart account. Instructions are located on the last page of this paperwork. If you have not heard from us regarding the results in 2 weeks, please contact this office.     

## 2016-09-07 NOTE — Progress Notes (Signed)
8/21/20188:44 AM  Rachel [name] 1950/07/12, 66 y.o. female [id number]  Chief Complaint  Patient presents with  . Medication Refill    Norvasc    HPI:   Patient is a 66 y.o. female with past medical history significant for HTN, GERD and HRT who presents today for refill of her BP meds. Reports normal EKG done earlier this year.   Patient reports she is doing well on current regime, Denies any side effects. Does not check BP at home.   Otherwise sees Gyn at [facility] hospital. Manages HRT, she reports normal mammograms, with upcoming mammo this Nov. Also reports normal Dexa last year.  She is worried about diabetes and would like to be checked. Her brother has DM. She denies any unintentional weight loss, polydipsia or polyuria.   She reports GERD is well controlled on PPI. However reports decreased appetite, denies abd pain, nausea, vomiting or changes in stool, denies any black tarry stools.   Depression screen [facility] 2/9 09/07/2016 03/26/2016 03/19/2016  Decreased Interest 0 0 0  Down, Depressed, Hopeless 0 0 0  PHQ - 2 Score 0 0 0    No Known Allergies  Current Outpatient Prescriptions on File Prior to Visit  Medication Sig Dispense Refill  . amLODipine (NORVASC) 5 MG tablet TAKE 1 TABLET BY MOUTH EVERY DAY 30 tablet 0  . aspirin 81 MG chewable tablet Chew by mouth daily.    Marland Kitchen estrogens, conjugated, (PREMARIN) 0.3 MG tablet Take 0.3 mg by mouth daily.    . Iron-Vitamins (GERITOL PO) Take by mouth daily.    . Multiple Vitamin (MULTIVITAMIN WITH MINERALS) TABS tablet Take 1 tablet by mouth daily.    Marland Kitchen omeprazole (PRILOSEC) 20 MG capsule Take 20 mg by mouth daily.    . ranitidine (ZANTAC) 150 MG tablet TAKE 1 TABLET (150 MG TOTAL) BY MOUTH AT BEDTIME. 30 tablet 0   No current facility-administered medications on file prior to visit.     Past Medical History:  Diagnosis Date  . GERD (gastroesophageal reflux disease)   . Herpes   . Hypertension     Past Surgical  History:  Procedure Laterality Date  . ABDOMINAL HYSTERECTOMY    . COLONOSCOPY      Social History  Substance Use Topics  . Smoking status: Never Smoker  . Smokeless tobacco: Never Used  . Alcohol use No    Family History  Problem Relation Age of Onset  . Cancer Mother   . Stroke Mother   . Heart disease Mother   . Heart disease Father   . Cancer Father        prostate cancer  . Colon cancer Maternal Grandmother        pt unsure, MGM might have had colon CA  . Esophageal cancer Neg Hx   . Stomach cancer Neg Hx   . Rectal cancer Neg Hx     Review of Systems  Constitutional: Negative for chills and fever.  Respiratory: Negative for cough and shortness of breath.   Cardiovascular: Negative for chest pain, palpitations and leg swelling.  Gastrointestinal: Negative for abdominal pain, nausea and vomiting.  Musculoskeletal:       + chest wall pain, left side, intermittent, works as Glass blower/designer, lots of twisting  Neurological: Positive for headaches.  Endo/Heme/Allergies: Negative for polydipsia.     OBJECTIVE:  Blood pressure 132/67, pulse (!) 58, temperature 97.6 F (36.4 C), temperature source Oral, resp. rate 18, height 5' 1.81" (1.57 m), weight 145 lb  6.4 oz (66 kg), last menstrual period 08/01/2012, SpO2 99 %.  Physical Exam  Constitutional: She is oriented to person, place, and time and well-developed, well-nourished, and in no distress.  HENT:  Head: Normocephalic and atraumatic.  Right Ear: Tympanic membrane and ear canal normal.  Mouth/Throat: Oropharynx is clear and moist.  Eyes: Pupils are equal, round, and reactive to light. Conjunctivae and EOM are normal.  Neck: Neck supple.  Cardiovascular: Normal rate, regular rhythm and normal heart sounds.  Exam reveals no gallop and no friction rub.   No murmur heard. Pulmonary/Chest: Effort normal and breath sounds normal. She has no wheezes. She has no rales. She exhibits tenderness (left costosternal joint ~  rib 6).  Abdominal: Soft. Bowel sounds are normal. She exhibits no distension and no mass. There is tenderness (mild, generalized). There is no rebound and no guarding.  Musculoskeletal: She exhibits no edema.  Lymphadenopathy:    She has no cervical adenopathy.  Neurological: She is alert and oriented to person, place, and time. Gait normal.  Skin: Skin is warm and dry.    Results for orders placed or performed during the hospital encounter of 03/15/16  Protime-INR  Result Value Ref Range   Prothrombin Time 14.6 11.4 - 15.2 seconds   INR 1.13   APTT  Result Value Ref Range   aPTT 31 24 - 36 seconds  CBC  Result Value Ref Range   WBC 6.7 4.0 - 10.5 K/uL   RBC 4.09 3.87 - 5.11 MIL/uL   Hemoglobin 11.4 (L) 12.0 - 15.0 g/dL   HCT 35.8 (L) 36.0 - 46.0 %   MCV 87.5 78.0 - 100.0 fL   MCH 27.9 26.0 - 34.0 pg   MCHC 31.8 30.0 - 36.0 g/dL   RDW 12.8 11.5 - 15.5 %   Platelets 173 150 - 400 K/uL  Differential  Result Value Ref Range   Neutrophils Relative % 53 %   Neutro Abs 3.5 1.7 - 7.7 K/uL   Lymphocytes Relative 40 %   Lymphs Abs 2.7 0.7 - 4.0 K/uL   Monocytes Relative 6 %   Monocytes Absolute 0.4 0.1 - 1.0 K/uL   Eosinophils Relative 1 %   Eosinophils Absolute 0.1 0.0 - 0.7 K/uL   Basophils Relative 0 %   Basophils Absolute 0.0 0.0 - 0.1 K/uL  Comprehensive metabolic panel  Result Value Ref Range   Sodium 140 135 - 145 mmol/L   Potassium 3.5 3.5 - 5.1 mmol/L   Chloride 104 101 - 111 mmol/L   CO2 25 22 - 32 mmol/L   Glucose, Bld 123 (H) 65 - 99 mg/dL   BUN 13 6 - 20 mg/dL   Creatinine, Ser 0.68 0.44 - 1.00 mg/dL   Calcium 9.1 8.9 - 10.3 mg/dL   Total Protein 6.6 6.5 - 8.1 g/dL   Albumin 3.8 3.5 - 5.0 g/dL   AST 20 15 - 41 U/L   ALT 13 (L) 14 - 54 U/L   Alkaline Phosphatase 52 38 - 126 U/L   Total Bilirubin 0.5 0.3 - 1.2 mg/dL   GFR calc non Af Amer >60 >60 mL/min   GFR calc Af Amer >60 >60 mL/min   Anion gap 11 5 - 15  I-stat troponin, ED  Result Value Ref Range     Troponin i, poc 0.00 0.00 - 0.08 ng/mL   Comment 3          CBG monitoring, ED  Result Value Ref Range   Glucose-Capillary  115 (H) 65 - 99 mg/dL   Comment 1 Notify RN    Comment 2 Document in Chart   I-Stat Chem 8, ED  Result Value Ref Range   Sodium 142 135 - 145 mmol/L   Potassium 3.9 3.5 - 5.1 mmol/L   Chloride 105 101 - 111 mmol/L   BUN 19 6 - 20 mg/dL   Creatinine, Ser 0.60 0.44 - 1.00 mg/dL   Glucose, Bld 128 (H) 65 - 99 mg/dL   Calcium, Ion 1.18 1.15 - 1.40 mmol/L   TCO2 29 0 - 100 mmol/L   Hemoglobin 12.6 12.0 - 15.0 g/dL   HCT 37.0 36.0 - 46.0 %     ASSESSMENT and PLAN:  1. Essential hypertension BP acceptable today, < 140/90. Med refilled. Ordering routine labs.  - Basic Metabolic Panel - Lipid panel - TSH - Hemoglobin A1c  2. Costochondritis Discussed new diagnosis with patient. Discussed conservative measures of rest, OTC nsaids/apap, mosit heat.  3. Immunization counseling Had long conversation with patient regarding pneumonia immunization, patient declines with informed consent  4. Hormone replacement therapy (postmenopausal) Managed by Gyn, reports mammo later this year.  5. FHx: type 2 diabetes mellitus Asymptomactic. Ordering A1c as requested given risk factors and no recent screenings. - Hemoglobin A1c      Rutherford Guys, MD [facility] at [place], [place] [address] Ph.  [phone] Fax [phone]

## 2016-09-08 LAB — BASIC METABOLIC PANEL
BUN/Creatinine Ratio: 29 — ABNORMAL HIGH (ref 12–28)
BUN: 16 mg/dL (ref 8–27)
CO2: 21 mmol/L (ref 20–29)
Calcium: 9.4 mg/dL (ref 8.7–10.3)
Chloride: 104 mmol/L (ref 96–106)
Creatinine, Ser: 0.55 mg/dL — ABNORMAL LOW (ref 0.57–1.00)
GFR calc Af Amer: 113 mL/min/{1.73_m2} (ref >59)
GFR calc non Af Amer: 98 mL/min/{1.73_m2} (ref >59)
Glucose: 88 mg/dL (ref 65–99)
Potassium: 4.1 mmol/L (ref 3.5–5.2)
Sodium: 142 mmol/L (ref 134–144)

## 2016-09-08 LAB — LIPID PANEL
Chol/HDL Ratio: 1.6 ratio (ref 0.0–4.4)
Cholesterol, Total: 159 mg/dL (ref 100–199)
HDL: 98 mg/dL (ref >39)
LDL Calculated: 52 mg/dL (ref 0–99)
Triglycerides: 44 mg/dL (ref 0–149)
VLDL Cholesterol Cal: 9 mg/dL (ref 5–40)

## 2016-09-08 LAB — TSH: TSH: 2.21 u[IU]/mL (ref 0.450–4.500)

## 2016-09-08 LAB — HEMOGLOBIN A1C
Est. average glucose Bld gHb Est-mCnc: 123 mg/dL
Hgb A1c MFr Bld: 5.9 % — ABNORMAL HIGH (ref 4.8–5.6)

## 2016-09-13 ENCOUNTER — Encounter: Payer: Self-pay | Admitting: *Deleted

## 2016-11-27 ENCOUNTER — Ambulatory Visit: Payer: Managed Care, Other (non HMO) | Admitting: Emergency Medicine

## 2016-11-27 ENCOUNTER — Encounter: Payer: Self-pay | Admitting: Emergency Medicine

## 2016-11-27 ENCOUNTER — Other Ambulatory Visit: Payer: Self-pay

## 2016-11-27 VITALS — BP 134/64 | HR 64 | Temp 98.8°F | Resp 16 | Ht 61.0 in | Wt 142.6 lb

## 2016-11-27 DIAGNOSIS — J029 Acute pharyngitis, unspecified: Secondary | ICD-10-CM | POA: Diagnosis not present

## 2016-11-27 DIAGNOSIS — J22 Unspecified acute lower respiratory infection: Secondary | ICD-10-CM | POA: Insufficient documentation

## 2016-11-27 DIAGNOSIS — R05 Cough: Secondary | ICD-10-CM | POA: Diagnosis not present

## 2016-11-27 DIAGNOSIS — R0989 Other specified symptoms and signs involving the circulatory and respiratory systems: Secondary | ICD-10-CM

## 2016-11-27 DIAGNOSIS — R059 Cough, unspecified: Secondary | ICD-10-CM | POA: Insufficient documentation

## 2016-11-27 MED ORDER — AMOXICILLIN-POT CLAVULANATE 875-125 MG PO TABS: 1.0000 | ORAL_TABLET | Freq: Two times a day (BID) | ORAL | 0 refills | Status: AC

## 2016-11-27 MED ORDER — PROMETHAZINE-CODEINE 6.25-10 MG/5ML PO SYRP: 5.0000 mL | ORAL_SOLUTION | Freq: Every evening | ORAL | 0 refills | Status: DC | PRN

## 2016-11-27 NOTE — Patient Instructions (Addendum)
     IF you received an x-ray today, you will receive an invoice from [facility]. Please contact [facility] at [phone] with questions or concerns regarding your invoice.   IF you received labwork today, you will receive an invoice from [facility]. Please contact [facility] at [phone] with questions or concerns regarding your invoice.   Our billing staff will not be able to assist you with questions regarding bills from these companies.  You will be contacted with the lab results as soon as they are available. The fastest way to get your results is to activate your My Chart account. Instructions are located on the last page of this paperwork. If you have not heard from us regarding the results in 2 weeks, please contact this office.     Upper Respiratory Infection, Adult Most upper respiratory infections (URIs) are caused by a virus. A URI affects the nose, throat, and upper air passages. The most common type of URI is often called "the common cold." Follow these instructions at home:  Take medicines only as told by your doctor.  Gargle warm saltwater or take cough drops to comfort your throat as told by your doctor.  Use a warm mist humidifier or inhale steam from a shower to increase air moisture. This may make it easier to breathe.  Drink enough fluid to keep your pee (urine) clear or pale yellow.  Eat soups and other clear broths.  Have a healthy diet.  Rest as needed.  Go back to work when your fever is gone or your doctor says it is okay. ? You may need to stay home longer to avoid giving your URI to others. ? You can also wear a face mask and wash your hands often to prevent spread of the virus.  Use your inhaler more if you have asthma.  Do not use any tobacco products, including cigarettes, chewing tobacco, or electronic cigarettes. If you need help quitting, ask your doctor. Contact a doctor if:  You are getting worse, not better.  Your  symptoms are not helped by medicine.  You have chills.  You are getting more short of breath.  You have brown or red mucus.  You have yellow or brown discharge from your nose.  You have pain in your face, especially when you bend forward.  You have a fever.  You have puffy (swollen) neck glands.  You have pain while swallowing.  You have white areas in the back of your throat. Get help right away if:  You have very bad or constant: ? Headache. ? Ear pain. ? Pain in your forehead, behind your eyes, and over your cheekbones (sinus pain). ? Chest pain.  You have long-lasting (chronic) lung disease and any of the following: ? Wheezing. ? Long-lasting cough. ? Coughing up blood. ? A change in your usual mucus.  You have a stiff neck.  You have changes in your: ? Vision. ? Hearing. ? Thinking. ? Mood. This information is not intended to replace advice given to you by your health care provider. Make sure you discuss any questions you have with your health care provider. Document Released: 06/23/2007 Document Revised: 09/07/2015 Document Reviewed: 04/11/2013 Elsevier Interactive Patient Education  2018 [employer]  

## 2016-11-27 NOTE — Progress Notes (Signed)
Rachel [name] 66 y.o.   Chief Complaint  Patient presents with  . Ear Pain  . Sore Throat    and chest congestion x 2 days    HISTORY OF PRESENT ILLNESS: [name] complaining of URI symptoms x 2 days.  Cough  This is a new problem. The current episode started in the past 7 days. The problem has been gradually worsening. The problem occurs constantly. The cough is productive of sputum. Associated symptoms include ear congestion, ear pain, nasal congestion and a sore throat. Pertinent negatives include no chest pain, chills, eye redness, fever, headaches, heartburn, hemoptysis, myalgias, rash, rhinorrhea, shortness of breath, weight loss or wheezing. Associated symptoms comments: Chest congestion. Nothing aggravates the symptoms. She has tried nothing for the symptoms. There is no history of asthma, COPD or pneumonia.     Prior to Admission medications   Medication Sig Start Date End Date Taking? Authorizing Provider  acyclovir (ZOVIRAX) 400 MG tablet Take 400 mg by mouth 2 (two) times daily.   Yes [provider]  amLODipine (NORVASC) 5 MG tablet Take 1 tablet (5 mg total) by mouth daily. 09/07/16 12/06/16 Yes Rutherford Guys, MD  aspirin 81 MG chewable tablet Chew by mouth daily.   Yes [provider]  calcium carbonate (CALCIUM 600) 600 MG TABS tablet Take 600 mg by mouth 2 (two) times daily with a meal.   Yes [provider]  estrogens, conjugated, (PREMARIN) 0.3 MG tablet Take 0.3 mg by mouth daily.   Yes [provider]  Iron-Vitamins (GERITOL PO) Take by mouth daily.   Yes [provider]  Multiple Vitamin (MULTIVITAMIN WITH MINERALS) TABS tablet Take 1 tablet by mouth daily.   Yes [provider]  omeprazole (PRILOSEC) 20 MG capsule Take 20 mg by mouth daily.   Yes [provider]    No Known Allergies  Patient Active Problem List   Diagnosis Date Noted  . Hormone replacement therapy  (postmenopausal) 09/07/2016  . Esophageal reflux 02/17/2015  . Essential hypertension 11/30/2012    Past Medical History:  Diagnosis Date  . GERD (gastroesophageal reflux disease)   . Herpes   . Hypertension     Past Surgical History:  Procedure Laterality Date  . ABDOMINAL HYSTERECTOMY    . COLONOSCOPY      Social History   Socioeconomic History  . Marital status: Married    Spouse name: Not on file  . Number of children: Not on file  . Years of education: Not on file  . Highest education level: Not on file  Social Needs  . Financial resource strain: Not on file  . Food insecurity - worry: Not on file  . Food insecurity - inability: Not on file  . Transportation needs - medical: Not on file  . Transportation needs - non-medical: Not on file  Occupational History  . Not on file  Tobacco Use  . Smoking status: Never Smoker  . Smokeless tobacco: Never Used  Substance and Sexual Activity  . Alcohol use: No  . Drug use: No  . Sexual activity: Not on file  Other Topics Concern  . Not on file  Social History Narrative  . Not on file    Family History  Problem Relation Age of Onset  . Cancer Mother   . Stroke Mother   . Heart disease Mother   . Heart disease Father   . Cancer Father        prostate  cancer  . Colon cancer Maternal Grandmother        pt unsure, MGM might have had colon CA  . Esophageal cancer Neg Hx   . Stomach cancer Neg Hx   . Rectal cancer Neg Hx      Review of Systems  Constitutional: Negative for chills, fever and weight loss.  HENT: Positive for congestion, ear pain and sore throat. Negative for nosebleeds, rhinorrhea and sinus pain.   Eyes: Negative for discharge and redness.  Respiratory: Positive for cough. Negative for hemoptysis, shortness of breath and wheezing.   Cardiovascular: Negative for chest pain, palpitations and leg swelling.  Gastrointestinal: Negative for abdominal pain, blood in stool, diarrhea, heartburn, melena,  nausea and vomiting.  Genitourinary: Negative for dysuria and hematuria.  Musculoskeletal: Negative for back pain, myalgias and neck pain.  Skin: Negative for rash.  Neurological: Positive for weakness. Negative for dizziness and headaches.  Endo/Heme/Allergies: Negative.   All other systems reviewed and are negative.  Vitals:   11/27/16 1048  BP: 134/64  Pulse: 64  Resp: 16  Temp: 98.8 F (37.1 C)  SpO2: 99%     Physical Exam  Constitutional: She is oriented to person, place, and time. She appears well-developed and well-nourished.  HENT:  Head: Normocephalic and atraumatic.  Right Ear: Tympanic membrane, external ear and ear canal normal.  Left Ear: Tympanic membrane, external ear and ear canal normal.  Nose: Nose normal.  Mouth/Throat: Oropharynx is clear and moist.  Eyes: Conjunctivae and EOM are normal. Pupils are equal, round, and reactive to light.  Neck: Normal range of motion. Neck supple. No JVD present. No thyromegaly present.  Cardiovascular: Normal rate, regular rhythm, normal heart sounds and intact distal pulses.  Pulmonary/Chest: Effort normal and breath sounds normal.  Abdominal: Soft. Bowel sounds are normal. There is no tenderness.  Musculoskeletal: Normal range of motion.  Lymphadenopathy:    She has no cervical adenopathy.  Neurological: She is alert and oriented to person, place, and time. No sensory deficit. She exhibits normal muscle tone.  Skin: Skin is warm and dry. Capillary refill takes less than 2 seconds.  Psychiatric: She has a normal mood and affect. Her behavior is normal.  Vitals reviewed.    ASSESSMENT & PLAN: Rachel [name] was seen today for ear pain and sore throat.  Diagnoses and all orders for this visit:  Sore throat  Cough -     promethazine-codeine (PHENERGAN WITH CODEINE) 6.25-10 MG/5ML syrup; Take 5 mLs at bedtime as needed by mouth for cough.  Chest congestion  Lower respiratory infection -     amoxicillin-clavulanate  (AUGMENTIN) 875-125 MG tablet; Take 1 tablet 2 (two) times daily for 7 days by mouth.    Patient Instructions       IF you received an x-ray today, you will receive an invoice from [facility]. Please contact [facility] at [phone] with questions or concerns regarding your invoice.   IF you received labwork today, you will receive an invoice from [place]. Please contact [facility] at [phone] with questions or concerns regarding your invoice.   Our billing staff will not be able to assist you with questions regarding bills from these companies.  You will be contacted with the lab results as soon as they are available. The fastest way to get your results is to activate your My Chart account. Instructions are located on the last page of this paperwork. If you have not heard from [place] regarding the results in 2 weeks, please contact this  office.     Upper Respiratory Infection, Adult Most upper respiratory infections (URIs) are caused by a virus. A URI affects the nose, throat, and upper air passages. The most common type of URI is often called "the common cold." Follow these instructions at home:  Take medicines only as told by your doctor.  Gargle warm saltwater or take cough drops to comfort your throat as told by your doctor.  Use a warm mist humidifier or inhale steam from a shower to increase air moisture. This may make it easier to breathe.  Drink enough fluid to keep your pee (urine) clear or pale yellow.  Eat soups and other clear broths.  Have a healthy diet.  Rest as needed.  Go back to work when your fever is gone or your doctor says it is okay. ? You may need to stay home longer to avoid giving your URI to others. ? You can also wear a face mask and wash your hands often to prevent spread of the virus.  Use your inhaler more if you have asthma.  Do not use any tobacco products, including cigarettes, chewing tobacco, or electronic  cigarettes. If you need help quitting, ask your doctor. Contact a doctor if:  You are getting worse, not better.  Your symptoms are not helped by medicine.  You have chills.  You are getting more short of breath.  You have brown or red mucus.  You have yellow or brown discharge from your nose.  You have pain in your face, especially when you bend forward.  You have a fever.  You have puffy (swollen) neck glands.  You have pain while swallowing.  You have white areas in the back of your throat. Get help right away if:  You have very bad or constant: ? Headache. ? Ear pain. ? Pain in your forehead, behind your eyes, and over your cheekbones (sinus pain). ? Chest pain.  You have long-lasting (chronic) lung disease and any of the following: ? Wheezing. ? Long-lasting cough. ? Coughing up blood. ? A change in your usual mucus.  You have a stiff neck.  You have changes in your: ? Vision. ? Hearing. ? Thinking. ? Mood. This information is not intended to replace advice given to you by your health care provider. Make sure you discuss any questions you have with your health care provider. Document Released: 06/23/2007 Document Revised: 09/07/2015 Document Reviewed: 04/11/2013 Elsevier Interactive Patient Education  2018 Elsevier Inc.      Agustina Caroli, MD [facility]

## 2016-12-01 ENCOUNTER — Other Ambulatory Visit: Payer: Self-pay

## 2016-12-01 ENCOUNTER — Encounter: Payer: Self-pay | Admitting: Emergency Medicine

## 2016-12-01 ENCOUNTER — Ambulatory Visit: Payer: Managed Care, Other (non HMO) | Admitting: Emergency Medicine

## 2016-12-01 VITALS — BP 122/64 | HR 69 | Temp 98.0°F | Resp 18 | Ht 61.0 in | Wt 146.6 lb

## 2016-12-01 DIAGNOSIS — J029 Acute pharyngitis, unspecified: Secondary | ICD-10-CM | POA: Diagnosis not present

## 2016-12-01 DIAGNOSIS — M791 Myalgia, unspecified site: Secondary | ICD-10-CM | POA: Diagnosis not present

## 2016-12-01 DIAGNOSIS — B349 Viral infection, unspecified: Secondary | ICD-10-CM | POA: Diagnosis not present

## 2016-12-01 NOTE — Progress Notes (Addendum)
Rachel [name] 66 y.o.   Chief Complaint  Patient presents with  . Sore Throat    follow up pt states she doesnt feel any better and still feels congested     HISTORY OF PRESENT ILLNESS: [name] complaining of flu-like symptoms x 5 days; seen by me 4 days ago; persistent symptoms. Has cough and general aches.  HPI   Prior to Admission medications   Medication Sig Start Date End Date Taking? Authorizing Provider  acyclovir (ZOVIRAX) 400 MG tablet Take 400 mg by mouth 2 (two) times daily.   Yes [provider]  amLODipine (NORVASC) 5 MG tablet Take 1 tablet (5 mg total) by mouth daily. 09/07/16 12/06/16 Yes Rutherford Guys, MD  amoxicillin-clavulanate (AUGMENTIN) 875-125 MG tablet Take 1 tablet 2 (two) times daily for 7 days by mouth. 11/27/16 12/04/16 Yes Zoiee Wimmer, Ines Bloomer, MD  aspirin 81 MG chewable tablet Chew by mouth daily.   Yes [provider]  calcium carbonate (CALCIUM 600) 600 MG TABS tablet Take 600 mg by mouth 2 (two) times daily with a meal.   Yes [provider]  estrogens, conjugated, (PREMARIN) 0.3 MG tablet Take 0.3 mg by mouth daily.   Yes [provider]  Iron-Vitamins (GERITOL PO) Take by mouth daily.   Yes [provider]  Multiple Vitamin (MULTIVITAMIN WITH MINERALS) TABS tablet Take 1 tablet by mouth daily.   Yes [provider]  omeprazole (PRILOSEC) 20 MG capsule Take 20 mg by mouth daily.   Yes [provider]  promethazine-codeine (PHENERGAN WITH CODEINE) 6.25-10 MG/5ML syrup Take 5 mLs at bedtime as needed by mouth for cough. 11/27/16  Yes SagardiaInes Bloomer, MD    No Known Allergies  Patient Active Problem List   Diagnosis Date Noted  . Sore throat 11/27/2016  . Cough 11/27/2016  . Chest congestion 11/27/2016  . Lower respiratory infection 11/27/2016  . Hormone replacement therapy (postmenopausal) 09/07/2016  . Esophageal reflux 02/17/2015  . Essential  hypertension 11/30/2012    Past Medical History:  Diagnosis Date  . GERD (gastroesophageal reflux disease)   . Herpes   . Hypertension     Past Surgical History:  Procedure Laterality Date  . ABDOMINAL HYSTERECTOMY    . COLONOSCOPY      Social History   Socioeconomic History  . Marital status: Married    Spouse name: Not on file  . Number of children: Not on file  . Years of education: Not on file  . Highest education level: Not on file  Social Needs  . Financial resource strain: Not on file  . Food insecurity - worry: Not on file  . Food insecurity - inability: Not on file  . Transportation needs - medical: Not on file  . Transportation needs - non-medical: Not on file  Occupational History  . Not on file  Tobacco Use  . Smoking status: Never Smoker  . Smokeless tobacco: Never Used  Substance and Sexual Activity  . Alcohol use: No  . Drug use: No  . Sexual activity: Not on file  Other Topics Concern  . Not on file  Social History Narrative  . Not on file    Family History  Problem Relation Age of Onset  . Cancer Mother   . Stroke Mother   . Heart disease Mother   . Heart disease Father   . Cancer Father        prostate cancer  . Colon cancer Maternal  Grandmother        pt unsure, MGM might have had colon CA  . Esophageal cancer Neg Hx   . Stomach cancer Neg Hx   . Rectal cancer Neg Hx      Review of Systems  Constitutional: Positive for malaise/fatigue. Negative for chills and fever.  HENT: Positive for sore throat.   Eyes: Negative for discharge and redness.  Respiratory: Positive for cough.   Cardiovascular: Negative for chest pain and palpitations.  Gastrointestinal: Negative for abdominal pain, diarrhea, nausea and vomiting.  Genitourinary: Negative for dysuria and hematuria.  Musculoskeletal: Positive for myalgias.  Skin: Negative for rash.  Neurological: Positive for weakness. Negative for headaches.  Endo/Heme/Allergies: Negative.   All  other systems reviewed and are negative.  Vitals:   12/01/16 0947  BP: 122/64  Pulse: 69  Resp: 18  Temp: 98 F (36.7 C)  SpO2: 97%     Physical Exam  Constitutional: She is oriented to person, place, and time. She appears well-developed and well-nourished.  HENT:  Head: Normocephalic and atraumatic.  Right Ear: External ear normal.  Left Ear: External ear normal.  Nose: Nose normal.  Mouth/Throat: Oropharynx is clear and moist.  Eyes: Conjunctivae and EOM are normal. Pupils are equal, round, and reactive to light.  Neck: Normal range of motion. Neck supple. No JVD present. No thyromegaly present.  Cardiovascular: Normal rate, regular rhythm, normal heart sounds and intact distal pulses.  Pulmonary/Chest: Effort normal and breath sounds normal.  Abdominal: Soft. Bowel sounds are normal.  Musculoskeletal: Normal range of motion. She exhibits no edema or tenderness.  Neurological: She is alert and oriented to person, place, and time. No sensory deficit. She exhibits normal muscle tone.  Skin: Skin is warm and dry. Capillary refill takes less than 2 seconds. No rash noted.  Psychiatric: She has a normal mood and affect. Her behavior is normal.  Vitals reviewed.  A total of 25 minutes was spent in the room with the patient, greater than 50% of which was in counseling/coordination of care regarding present condition.   ASSESSMENT & PLAN: Rachel [name] was seen today for sore throat.  Diagnoses and all orders for this visit:  Viral illness  Sore throat  Generalized muscle ache    Patient Instructions       IF you received an x-ray today, you will receive an invoice from [facility]. Please contact [facility] at [phone] with questions or concerns regarding your invoice.   IF you received labwork today, you will receive an invoice from [place]. Please contact [facility] at [phone] with questions or concerns regarding your invoice.   Our billing  staff will not be able to assist you with questions regarding bills from these companies.  You will be contacted with the lab results as soon as they are available. The fastest way to get your results is to activate your My Chart account. Instructions are located on the last page of this paperwork. If you have not heard from [place] regarding the results in 2 weeks, please contact this office.     Viral Illness, Adult Viruses are tiny germs that can get into a person's body and cause illness. There are many different types of viruses, and they cause many types of illness. Viral illnesses can range from mild to severe. They can affect various parts of the body. Common illnesses that are caused by a virus include colds and the flu. Viral illnesses also include serious conditions such as HIV/AIDS (human immunodeficiency virus/acquired  immunodeficiency syndrome). A few viruses have been linked to certain cancers. What are the causes? Many types of viruses can cause illness. Viruses invade cells in your body, multiply, and cause the infected cells to malfunction or die. When the cell dies, it releases more of the virus. When this happens, you develop symptoms of the illness, and the virus continues to spread to other cells. If the virus takes over the function of the cell, it can cause the cell to divide and grow out of control, as is the case when a virus causes cancer. Different viruses get into the body in different ways. You can get a virus by:  Swallowing food or water that is contaminated with the virus.  Breathing in droplets that have been coughed or sneezed into the air by an infected person.  Touching a surface that has been contaminated with the virus and then touching your eyes, nose, or mouth.  Being bitten by an insect or animal that carries the virus.  Having sexual contact with a person who is infected with the virus.  Being exposed to blood or fluids that contain the virus, either  through an open cut or during a transfusion.  If a virus enters your body, your body's defense system (immune system) will try to fight the virus. You may be at higher risk for a viral illness if your immune system is weak. What are the signs or symptoms? Symptoms vary depending on the type of virus and the location of the cells that it invades. Common symptoms of the main types of viral illnesses include: Cold and flu viruses  Fever.  Headache.  Sore throat.  Muscle aches.  Nasal congestion.  Cough. Digestive system (gastrointestinal) viruses  Fever.  Abdominal pain.  Nausea.  Diarrhea. Liver viruses (hepatitis)  Loss of appetite.  Tiredness.  Yellowing of the skin (jaundice). Brain and spinal cord viruses  Fever.  Headache.  Stiff neck.  Nausea and vomiting.  Confusion or sleepiness. Skin viruses  Warts.  Itching.  Rash. Sexually transmitted viruses  Discharge.  Swelling.  Redness.  Rash. How is this treated? Viruses can be difficult to treat because they live within cells. Antibiotic medicines do not treat viruses because these drugs do not get inside cells. Treatment for a viral illness may include:  Resting and drinking plenty of fluids.  Medicines to relieve symptoms. These can include over-the-counter medicine for pain and fever, medicines for cough or congestion, and medicines to relieve diarrhea.  Antiviral medicines. These drugs are available only for certain types of viruses. They may help reduce flu symptoms if taken early. There are also many antiviral medicines for hepatitis and HIV/AIDS.  Some viral illnesses can be prevented with vaccinations. A common example is the flu shot. Follow these instructions at home: Medicines   Take over-the-counter and prescription medicines only as told by your health care provider.  If you were prescribed an antiviral medicine, take it as told by your health care provider. Do not stop taking  the medicine even if you start to feel better.  Be aware of when antibiotics are needed and when they are not needed. Antibiotics do not treat viruses. If your health care provider thinks that you may have a bacterial infection as well as a viral infection, you may get an antibiotic. ? Do not ask for an antibiotic prescription if you have been diagnosed with a viral illness. That will not make your illness go away faster. ? Frequently taking antibiotics when  they are not needed can lead to antibiotic resistance. When this develops, the medicine no longer works against the bacteria that it normally fights. General instructions  Drink enough fluids to keep your urine clear or pale yellow.  Rest as much as possible.  Return to your normal activities as told by your health care provider. Ask your health care provider what activities are safe for you.  Keep all follow-up visits as told by your health care provider. This is important. How is this prevented? Take these actions to reduce your risk of viral infection:  Eat a healthy diet and get enough rest.  Wash your hands often with soap and water. This is especially important when you are in public places. If soap and water are not available, use hand sanitizer.  Avoid close contact with friends and family who have a viral illness.  If you travel to areas where viral gastrointestinal infection is common, avoid drinking water or eating raw food.  Keep your immunizations up to date. Get a flu shot every year as told by your health care provider.  Do not share toothbrushes, nail clippers, razors, or needles with other people.  Always practice safe sex.  Contact a health care provider if:  You have symptoms of a viral illness that do not go away.  Your symptoms come back after going away.  Your symptoms get worse. Get help right away if:  You have trouble breathing.  You have a severe headache or a stiff neck.  You have severe  vomiting or abdominal pain. This information is not intended to replace advice given to you by your health care provider. Make sure you discuss any questions you have with your health care provider. Document Released: 05/16/2015 Document Revised: 06/18/2015 Document Reviewed: 05/16/2015 Elsevier Interactive Patient Education  2018 Elsevier Inc.      Agustina Caroli, MD [facility]

## 2016-12-01 NOTE — Patient Instructions (Addendum)
IF you received an x-ray today, you will receive an invoice from [facility]. Please contact [facility] at [phone] with questions or concerns regarding your invoice.   IF you received labwork today, you will receive an invoice from [facility]. Please contact [facility] at [phone] with questions or concerns regarding your invoice.   Our billing staff will not be able to assist you with questions regarding bills from these companies.  You will be contacted with the lab results as soon as they are available. The fastest way to get your results is to activate your My Chart account. Instructions are located on the last page of this paperwork. If you have not heard from [place] regarding the results in 2 weeks, please contact this office.     Viral Illness, Adult Viruses are tiny germs that can get into a person's body and cause illness. There are many different types of viruses, and they cause many types of illness. Viral illnesses can range from mild to severe. They can affect various parts of the body. Common illnesses that are caused by a virus include colds and the flu. Viral illnesses also include serious conditions such as HIV/AIDS (human immunodeficiency virus/acquired immunodeficiency syndrome). A few viruses have been linked to certain cancers. What are the causes? Many types of viruses can cause illness. Viruses invade cells in your body, multiply, and cause the infected cells to malfunction or die. When the cell dies, it releases more of the virus. When this happens, you develop symptoms of the illness, and the virus continues to spread to other cells. If the virus takes over the function of the cell, it can cause the cell to divide and grow out of control, as is the case when a virus causes cancer. Different viruses get into the body in different ways. You can get a virus by:  Swallowing food or water that is contaminated with the virus.  Breathing in droplets  that have been coughed or sneezed into the air by an infected person.  Touching a surface that has been contaminated with the virus and then touching your eyes, nose, or mouth.  Being bitten by an insect or animal that carries the virus.  Having sexual contact with a person who is infected with the virus.  Being exposed to blood or fluids that contain the virus, either through an open cut or during a transfusion.  If a virus enters your body, your body's defense system (immune system) will try to fight the virus. You may be at higher risk for a viral illness if your immune system is weak. What are the signs or symptoms? Symptoms vary depending on the type of virus and the location of the cells that it invades. Common symptoms of the main types of viral illnesses include: Cold and flu viruses  Fever.  Headache.  Sore throat.  Muscle aches.  Nasal congestion.  Cough. Digestive system (gastrointestinal) viruses  Fever.  Abdominal pain.  Nausea.  Diarrhea. Liver viruses (hepatitis)  Loss of appetite.  Tiredness.  Yellowing of the skin (jaundice). Brain and spinal cord viruses  Fever.  Headache.  Stiff neck.  Nausea and vomiting.  Confusion or sleepiness. Skin viruses  Warts.  Itching.  Rash. Sexually transmitted viruses  Discharge.  Swelling.  Redness.  Rash. How is this treated? Viruses can be difficult to treat because they live within cells. Antibiotic medicines do not treat viruses because these drugs do not get inside cells. Treatment for a viral illness  may include:  Resting and drinking plenty of fluids.  Medicines to relieve symptoms. These can include over-the-counter medicine for pain and fever, medicines for cough or congestion, and medicines to relieve diarrhea.  Antiviral medicines. These drugs are available only for certain types of viruses. They may help reduce flu symptoms if taken early. There are also many antiviral medicines  for hepatitis and HIV/AIDS.  Some viral illnesses can be prevented with vaccinations. A common example is the flu shot. Follow these instructions at home: Medicines   Take over-the-counter and prescription medicines only as told by your health care provider.  If you were prescribed an antiviral medicine, take it as told by your health care provider. Do not stop taking the medicine even if you start to feel better.  Be aware of when antibiotics are needed and when they are not needed. Antibiotics do not treat viruses. If your health care provider thinks that you may have a bacterial infection as well as a viral infection, you may get an antibiotic. ? Do not ask for an antibiotic prescription if you have been diagnosed with a viral illness. That will not make your illness go away faster. ? Frequently taking antibiotics when they are not needed can lead to antibiotic resistance. When this develops, the medicine no longer works against the bacteria that it normally fights. General instructions  Drink enough fluids to keep your urine clear or pale yellow.  Rest as much as possible.  Return to your normal activities as told by your health care provider. Ask your health care provider what activities are safe for you.  Keep all follow-up visits as told by your health care provider. This is important. How is this prevented? Take these actions to reduce your risk of viral infection:  Eat a healthy diet and get enough rest.  Wash your hands often with soap and water. This is especially important when you are in public places. If soap and water are not available, use hand sanitizer.  Avoid close contact with friends and family who have a viral illness.  If you travel to areas where viral gastrointestinal infection is common, avoid drinking water or eating raw food.  Keep your immunizations up to date. Get a flu shot every year as told by your health care provider.  Do not share toothbrushes,  nail clippers, razors, or needles with other people.  Always practice safe sex.  Contact a health care provider if:  You have symptoms of a viral illness that do not go away.  Your symptoms come back after going away.  Your symptoms get worse. Get help right away if:  You have trouble breathing.  You have a severe headache or a stiff neck.  You have severe vomiting or abdominal pain. This information is not intended to replace advice given to you by your health care provider. Make sure you discuss any questions you have with your health care provider. Document Released: 05/16/2015 Document Revised: 06/18/2015 Document Reviewed: 05/16/2015 Elsevier Interactive Patient Education  [employer].

## 2016-12-03 ENCOUNTER — Other Ambulatory Visit: Payer: Self-pay | Admitting: Family Medicine

## 2017-05-06 ENCOUNTER — Encounter: Payer: Self-pay | Admitting: Physician Assistant

## 2017-05-06 ENCOUNTER — Ambulatory Visit: Payer: Managed Care, Other (non HMO) | Admitting: Physician Assistant

## 2017-05-06 VITALS — BP 112/68 | HR 76 | Temp 98.9°F | Resp 16 | Ht 61.0 in | Wt 145.8 lb

## 2017-05-06 DIAGNOSIS — B9789 Other viral agents as the cause of diseases classified elsewhere: Secondary | ICD-10-CM | POA: Diagnosis not present

## 2017-05-06 DIAGNOSIS — J069 Acute upper respiratory infection, unspecified: Secondary | ICD-10-CM

## 2017-05-06 MED ORDER — AZELASTINE HCL 0.1 % NA SOLN: 2.0000 | Freq: Two times a day (BID) | NASAL | 0 refills | Status: DC

## 2017-05-06 MED ORDER — GUAIFENESIN ER 1200 MG PO TB12: 1.0000 | ORAL_TABLET | Freq: Two times a day (BID) | ORAL | 1 refills | Status: DC | PRN

## 2017-05-06 MED ORDER — BENZONATATE 100 MG PO CAPS
100.0000 mg | ORAL_CAPSULE | Freq: Three times a day (TID) | ORAL | 0 refills | Status: DC | PRN
Start: 2017-05-06 — End: 2017-06-15

## 2017-05-06 NOTE — Progress Notes (Signed)
Subjective:    Patient ID: Rachel [name], female    DOB: May 05, 1950, 67 y.o.   MRN: [id number] Chief Complaint  Patient presents with  . URI    cough x 2 days, yellow phlegm    HPI  67 yo female presents for evaluation of cough (yellow sputum), congestion, rhinorrhea (clear), sore throat. Reports some congestion in her chest x 2 days that hasn't improved.   Has tried vicks to help with breathing, vinegar to help with congestion ("old remedy"), mucinex with some relief.  A lot of people at work sick with the flu. Denies flu shot this year.  Denies smoking, SOB, wheezing, confusion, myalgias.  Review of Systems  Constitutional: Positive for chills and fatigue. Negative for diaphoresis and fever.  HENT: Positive for congestion, rhinorrhea, sore throat and voice change. Negative for ear discharge, ear pain, postnasal drip, sinus pressure, sinus pain and sneezing.   Eyes: Positive for pain (retrobulbar). Negative for photophobia, discharge, redness, itching and visual disturbance.  Respiratory: Positive for cough (yellow sputum). Negative for wheezing.   Cardiovascular: Negative for palpitations.  Gastrointestinal: Negative for abdominal pain, diarrhea, nausea and vomiting.  Endocrine: Negative.   Genitourinary: Negative.   Musculoskeletal: Positive for myalgias (L posterior thigh). Negative for back pain, gait problem, joint swelling, neck pain and neck stiffness.  Skin: Negative.   Allergic/Immunologic: Positive for environmental allergies.  Neurological: Negative.   Hematological: Negative.   Psychiatric/Behavioral: Negative.     Patient Active Problem List   Diagnosis Date Noted  . Viral illness 12/01/2016  . Generalized muscle ache 12/01/2016  . Sore throat 11/27/2016  . Cough 11/27/2016  . Chest congestion 11/27/2016  . Lower respiratory infection 11/27/2016  . Hormone replacement therapy (postmenopausal) 09/07/2016  . Esophageal reflux 02/17/2015  . Essential  hypertension 11/30/2012    Past Medical History:  Diagnosis Date  . GERD (gastroesophageal reflux disease)   . Herpes   . Hypertension     Prior to Admission medications   Medication Sig Start Date End Date Taking? Authorizing Provider  acyclovir (ZOVIRAX) 400 MG tablet Take 400 mg by mouth 2 (two) times daily.   Yes [provider]  amLODipine (NORVASC) 5 MG tablet TAKE 1 TABLET BY MOUTH EVERY DAY 12/03/16  Yes Rutherford Guys, MD  aspirin 81 MG chewable tablet Chew by mouth daily.   Yes [provider]  calcium carbonate (CALCIUM 600) 600 MG TABS tablet Take 600 mg by mouth 2 (two) times daily with a meal.   Yes [provider]  estrogens, conjugated, (PREMARIN) 0.3 MG tablet Take 0.3 mg by mouth daily.   Yes [provider]  Iron-Vitamins (GERITOL PO) Take by mouth daily.   Yes [provider]  Multiple Vitamin (MULTIVITAMIN WITH MINERALS) TABS tablet Take 1 tablet by mouth daily.   Yes [provider]  omeprazole (PRILOSEC) 20 MG capsule Take 20 mg by mouth daily.   Yes [provider]  promethazine-codeine (PHENERGAN WITH CODEINE) 6.25-10 MG/5ML syrup Take 5 mLs at bedtime as needed by mouth for cough. Patient not taking: Reported on 05/06/2017 11/27/16   Horald Pollen, MD    No Known Allergies     Objective:   Physical Exam  Constitutional: She is oriented to person, place, and time. She appears well-developed and well-nourished. No distress.  BP 112/68   Pulse 76   Temp 98.9 F (37.2 C)   Resp 16   Ht 5\' 1"  (1.549 m)   Wt 145  lb 12.8 oz (66.1 kg)   LMP 08/01/2012   SpO2 98%   BMI 27.55 kg/m    HENT:  Head: Normocephalic and atraumatic.  Right Ear: External ear normal.  Left Ear: External ear normal.  Nose: Mucosal edema and rhinorrhea present. No nose lacerations or sinus tenderness. Right sinus exhibits no maxillary sinus tenderness and no frontal sinus tenderness. Left sinus exhibits no  maxillary sinus tenderness and no frontal sinus tenderness.  Mouth/Throat: Oropharynx is clear and moist and mucous membranes are normal. No oropharyngeal exudate.  Eyes: Pupils are equal, round, and reactive to light. Conjunctivae and EOM are normal. Right eye exhibits no discharge. Left eye exhibits no discharge.  Neck: Normal range of motion. Neck supple. No tracheal deviation present. No thyromegaly present.  Cardiovascular: Normal rate, regular rhythm and intact distal pulses. Exam reveals no gallop and no friction rub.  No murmur heard. Pulses:      Radial pulses are 2+ on the right side, and 2+ on the left side.       Dorsalis pedis pulses are 2+ on the right side, and 2+ on the left side.       Posterior tibial pulses are 2+ on the right side, and 2+ on the left side.  Pulmonary/Chest: Effort normal and breath sounds normal. No respiratory distress. She has no wheezes. She has no rales.  Abdominal: Soft. Bowel sounds are normal. She exhibits no distension. There is no tenderness.  Musculoskeletal: Normal range of motion. She exhibits no edema.  Lymphadenopathy:    She has cervical adenopathy.  Neurological: She is alert and oriented to person, place, and time. She has normal reflexes.  Skin: Skin is warm and dry. She is not diaphoretic. No erythema.  Psychiatric: She has a normal mood and affect. Her behavior is normal.       Assessment & Plan:  1. Viral URI with cough Likely viral URI due to timeline and symptomology. Unlikely pneumonia (negative CURB-65, low risk), unlikely sinusitis due to timeline. Informed to f/u if symptoms worsen. Unlikely flu despite exposure. - Guaifenesin (MUCINEX MAXIMUM STRENGTH) 1200 MG TB12; Take 1 tablet (1,200 mg total) by mouth every 12 (twelve) hours as needed.  Dispense: 14 tablet; Refill: 1 - azelastine (ASTELIN) 0.1 % nasal spray; Place 2 sprays into both nostrils 2 (two) times daily. Use in each nostril as directed  Dispense: 30 mL; Refill: 0 -  benzonatate (TESSALON) 100 MG capsule; Take 1-2 capsules (100-200 mg total) by mouth 3 (three) times daily as needed for cough.  Dispense: 40 capsule; Refill: 0   Return if symptoms worsen or fail to improve, for evaluation of LEFT leg pain, once you are well, with PA Clark.

## 2017-05-06 NOTE — Progress Notes (Signed)
Patient ID: Rachel [name], female    DOB: 09-24-1950, 67 y.o.   MRN: [id number]  PCP: Tereasa Coop, PA-C  Chief Complaint  Patient presents with  . URI    cough x 2 days, yellow phlegm    Subjective:   Presents for evaluation of productive cough x 2 days.  She has yellow sputum with cough, runny nose, sore throat. No SOB, wheezing, body aches, fever.  Vicks, vinegar and Mucinex with modest improvement. Multiple sick contacts at work, including to influenza. No flu vaccine this season. Non-smoker.   Review of Systems Constitutional: Positive for chills and fatigue. Negative for diaphoresis and fever.  HENT: Positive for congestion, rhinorrhea, sore throat and voice change. Negative for ear discharge, ear pain, postnasal drip, sinus pressure, sinus pain and sneezing.   Eyes: Positive for pain (retrobulbar). Negative for photophobia, discharge, redness, itching and visual disturbance.  Respiratory: Positive for cough (yellow sputum). Negative for wheezing.   Cardiovascular: Negative for palpitations.  Gastrointestinal: Negative for abdominal pain, diarrhea, nausea and vomiting.  Endocrine: Negative.   Genitourinary: Negative.   Musculoskeletal: Positive for myalgias (L posterior thigh). Negative for back pain, gait problem, joint swelling, neck pain and neck stiffness.  Skin: Negative.   Allergic/Immunologic: Positive for environmental allergies.  Neurological: Negative.   Hematological: Negative.   Psychiatric/Behavioral: Negative.        Patient Active Problem List   Diagnosis Date Noted  . Viral illness 12/01/2016  . Generalized muscle ache 12/01/2016  . Sore throat 11/27/2016  . Cough 11/27/2016  . Chest congestion 11/27/2016  . Lower respiratory infection 11/27/2016  . Hormone replacement therapy (postmenopausal) 09/07/2016  . Esophageal reflux 02/17/2015  . Essential hypertension 11/30/2012     Prior to Admission medications   Medication  Sig Start Date End Date Taking? Authorizing Provider  acyclovir (ZOVIRAX) 400 MG tablet Take 400 mg by mouth 2 (two) times daily.    [provider]  amLODipine (NORVASC) 5 MG tablet TAKE 1 TABLET BY MOUTH EVERY DAY 12/03/16   Rutherford Guys, MD  aspirin 81 MG chewable tablet Chew by mouth daily.    [provider]  calcium carbonate (CALCIUM 600) 600 MG TABS tablet Take 600 mg by mouth 2 (two) times daily with a meal.    [provider]  estrogens, conjugated, (PREMARIN) 0.3 MG tablet Take 0.3 mg by mouth daily.    [provider]  Iron-Vitamins (GERITOL PO) Take by mouth daily.    [provider]  Multiple Vitamin (MULTIVITAMIN WITH MINERALS) TABS tablet Take 1 tablet by mouth daily.    [provider]  omeprazole (PRILOSEC) 20 MG capsule Take 20 mg by mouth daily.    [provider]  promethazine-codeine (PHENERGAN WITH CODEINE) 6.25-10 MG/5ML syrup Take 5 mLs at bedtime as needed by mouth for cough. 11/27/16   Horald Pollen, MD     No Known Allergies     Objective:  Physical Exam  Constitutional: She is oriented to person, place, and time. She appears well-developed and well-nourished. No distress.  BP 112/68   Pulse 76   Temp 98.9 F (37.2 C)   Resp 16   Ht 5\' 1"  (1.549 m)   Wt 145 lb 12.8 oz (66.1 kg)   LMP 08/01/2012   SpO2 98%   BMI 27.55 kg/m    HENT:  Head: Normocephalic and atraumatic.  Right Ear: Hearing, tympanic membrane, external ear and ear canal normal.  Left Ear:  Hearing, tympanic membrane, external ear and ear canal normal.  Nose: Mucosal edema and rhinorrhea present.  No foreign bodies. Right sinus exhibits no maxillary sinus tenderness and no frontal sinus tenderness. Left sinus exhibits no maxillary sinus tenderness and no frontal sinus tenderness.  Mouth/Throat: Uvula is midline, oropharynx is clear and moist and mucous membranes are normal. No uvula swelling. No oropharyngeal exudate.    Eyes: Pupils are equal, round, and reactive to light. Conjunctivae and EOM are normal. Right eye exhibits no discharge. Left eye exhibits no discharge. No scleral icterus.  Neck: Trachea normal, normal range of motion and full passive range of motion without pain. Neck supple. No thyroid mass and no thyromegaly present.  Cardiovascular: Normal rate, regular rhythm and normal heart sounds.  Pulmonary/Chest: Effort normal and breath sounds normal.  Lymphadenopathy:       Head (right side): No submandibular, no tonsillar, no preauricular, no posterior auricular and no occipital adenopathy present.       Head (left side): No submandibular, no tonsillar, no preauricular and no occipital adenopathy present.    She has no cervical adenopathy.       Right: No supraclavicular adenopathy present.       Left: No supraclavicular adenopathy present.  Neurological: She is alert and oriented to person, place, and time. She has normal strength. No cranial nerve deficit or sensory deficit.  Skin: Skin is warm, dry and intact. No rash noted.  Psychiatric: She has a normal mood and affect. Her speech is normal and behavior is normal.           Assessment & Plan:   1. Viral URI with cough Supportive care.  Anticipatory guidance.  RTC if symptoms worsen/persist. - Guaifenesin (MUCINEX MAXIMUM STRENGTH) 1200 MG TB12; Take 1 tablet (1,200 mg total) by mouth every 12 (twelve) hours as needed.  Dispense: 14 tablet; Refill: 1 - azelastine (ASTELIN) 0.1 % nasal spray; Place 2 sprays into both nostrils 2 (two) times daily. Use in each nostril as directed  Dispense: 30 mL; Refill: 0 - benzonatate (TESSALON) 100 MG capsule; Take 1-2 capsules (100-200 mg total) by mouth 3 (three) times daily as needed for cough.  Dispense: 40 capsule; Refill: 0    Return if symptoms worsen or fail to improve, for evaluation of LEFT leg pain, once you are well, with PA Clark.   Fara Chute, PA-C [facility]

## 2017-05-06 NOTE — Patient Instructions (Addendum)
   IF you received an x-ray today, you will receive an invoice from [facility]. Please contact [facility] at [phone] with questions or concerns regarding your invoice.   IF you received labwork today, you will receive an invoice from [facility]. Please contact [facility] at [phone] with questions or concerns regarding your invoice.   Our billing staff will not be able to assist you with questions regarding bills from these companies.  You will be contacted with the lab results as soon as they are available. The fastest way to get your results is to activate your My Chart account. Instructions are located on the last page of this paperwork. If you have not heard from us regarding the results in 2 weeks, please contact this office.     Viral Respiratory Infection A viral respiratory infection is an illness that affects parts of the body used for breathing, like the lungs, nose, and throat. It is caused by a germ called a virus. Some examples of this kind of infection are:  A cold.  The flu (influenza).  A respiratory syncytial virus (RSV) infection.  How do I know if I have this infection? Most of the time this infection causes:  A stuffy or runny nose.  Yellow or green fluid in the nose.  A cough.  Sneezing.  Tiredness (fatigue).  Achy muscles.  A sore throat.  Sweating or chills.  A fever.  A headache.  How is this infection treated? If the flu is diagnosed early, it may be treated with an antiviral medicine. This medicine shortens the length of time a person has symptoms. Symptoms may be treated with over-the-counter and prescription medicines, such as:  Expectorants. These make it easier to cough up mucus.  Decongestant nasal sprays.  Doctors do not prescribe antibiotic medicines for viral infections. They do not work with this kind of infection. How do I know if I should stay home? To keep others from getting sick, stay home if you  have:  A fever.  A lasting cough.  A sore throat.  A runny nose.  Sneezing.  Muscles aches.  Headaches.  Tiredness.  Weakness.  Chills.  Sweating.  An upset stomach (nausea).  Follow these instructions at home:  Rest as much as possible.  Take over-the-counter and prescription medicines only as told by your doctor.  Drink enough fluid to keep your pee (urine) clear or pale yellow.  Gargle with salt water. Do this 3-4 times per day or as needed. To make a salt-water mixture, dissolve -1 tsp of salt in 1 cup of warm water. Make sure the salt dissolves all the way.  Use nose drops made from salt water. This helps with stuffiness (congestion). It also helps soften the skin around your nose.  Do not drink alcohol.  Do not use tobacco products, including cigarettes, chewing tobacco, and e-cigarettes. If you need help quitting, ask your doctor. Get help if:  Your symptoms last for 10 days or longer.  Your symptoms get worse over time.  You have a fever.  You have very bad pain in your face or forehead.  Parts of your jaw or neck become very swollen. Get help right away if:  You feel pain or pressure in your chest.  You have shortness of breath.  You faint or feel like you will faint.  You keep throwing up (vomiting).  You feel confused. This information is not intended to replace advice given to you by your health care provider.   Make sure you discuss any questions you have with your health care provider. Document Released: 12/18/2007 Document Revised: 06/12/2015 Document Reviewed: 06/12/2014 Elsevier Interactive Patient Education  2018 [employer]  

## 2017-05-13 ENCOUNTER — Telehealth: Payer: Self-pay | Admitting: Physician Assistant

## 2017-05-13 NOTE — Telephone Encounter (Signed)
Phone call to patient. Advised she will need to be seen if she has symptoms that are not improving. She states she has an appointment on 05/17/17, appt is at 1:20pm. Patient states she will call if any new or worsening symptoms before office visit.

## 2017-05-13 NOTE — Telephone Encounter (Signed)
Copied from [place] [id number]. Topic: Inquiry >> May 13, 2017  2:39 PM Margot Ables wrote: Reason for CRM: pt is still stopped up and still spitting out yellow mucus. Pt was seen 05/06/17 by Daphane Shepherd, PA. Pt is still using the medications that were prescribed. Pt asking if something else can be sent in for her to pick up at the drug store. PCP Philis Fendt, PA  CAll back # [phone]  [id number] - [place], [place] [facility] [address] [phone] (Phone) [phone] (Fax)

## 2017-05-17 ENCOUNTER — Ambulatory Visit: Payer: Managed Care, Other (non HMO) | Admitting: Physician Assistant

## 2017-05-26 ENCOUNTER — Other Ambulatory Visit: Payer: Self-pay | Admitting: Physician Assistant

## 2017-05-26 DIAGNOSIS — J069 Acute upper respiratory infection, unspecified: Secondary | ICD-10-CM

## 2017-05-26 DIAGNOSIS — B9789 Other viral agents as the cause of diseases classified elsewhere: Principal | ICD-10-CM

## 2017-06-14 ENCOUNTER — Encounter: Payer: Self-pay | Admitting: Family Medicine

## 2017-06-15 ENCOUNTER — Other Ambulatory Visit: Payer: Self-pay

## 2017-06-15 ENCOUNTER — Ambulatory Visit: Payer: Managed Care, Other (non HMO) | Admitting: Physician Assistant

## 2017-06-15 ENCOUNTER — Encounter: Payer: Self-pay | Admitting: Physician Assistant

## 2017-06-15 VITALS — BP 110/64 | HR 74 | Temp 98.7°F | Resp 16 | Ht 61.0 in | Wt 144.4 lb

## 2017-06-15 DIAGNOSIS — J069 Acute upper respiratory infection, unspecified: Secondary | ICD-10-CM | POA: Diagnosis not present

## 2017-06-15 DIAGNOSIS — B9789 Other viral agents as the cause of diseases classified elsewhere: Secondary | ICD-10-CM | POA: Diagnosis not present

## 2017-06-15 MED ORDER — BENZONATATE 100 MG PO CAPS: 100.0000 mg | ORAL_CAPSULE | Freq: Two times a day (BID) | ORAL | 0 refills | Status: DC | PRN

## 2017-06-15 MED ORDER — NAPROXEN 375 MG PO TABS: 375.0000 mg | ORAL_TABLET | Freq: Two times a day (BID) | ORAL | 0 refills | Status: DC

## 2017-06-15 MED ORDER — HYDROCODONE-HOMATROPINE 5-1.5 MG/5ML PO SYRP: 2.5000 mL | ORAL_SOLUTION | Freq: Every day | ORAL | 0 refills | Status: AC

## 2017-06-15 NOTE — Progress Notes (Signed)
06/16/2017 2:10 PM   DOB: May 18, 1950 / MRN: [id number]  SUBJECTIVE:  Rachel [name] is a 66 y.o. female presenting for cough, nasal congestion, sore throat.  Denies fever.  He is eating well. Taking OTC analgesics with some relief. Denies chest pain, SOB, new DOE, orthopnea, leg swelling.   She has No Known Allergies.   She  has a past medical history of GERD (gastroesophageal reflux disease), Herpes, and Hypertension.    She  reports that she has never smoked. She has never used smokeless tobacco. She reports that she does not drink alcohol or use drugs. She  has no sexual activity history on file. The patient  has a past surgical history that includes Abdominal hysterectomy and Colonoscopy.  Her family history includes Cancer in her father and mother; Colon cancer in her maternal grandmother; Heart disease in her father and mother; Stroke in her mother.  Review of Systems  Constitutional: Negative for diaphoresis.  HENT: Positive for congestion and sore throat.   Eyes: Negative.   Respiratory: Positive for sputum production and shortness of breath. Negative for hemoptysis and wheezing.   Cardiovascular: Negative for chest pain, orthopnea and leg swelling.  Gastrointestinal: Negative for abdominal pain, blood in stool, constipation, diarrhea, heartburn, melena, nausea and vomiting.  Genitourinary: Negative for dysuria, flank pain, frequency, hematuria and urgency.  Neurological: Negative for dizziness, sensory change, speech change, focal weakness and headaches.    The problem list and medications were reviewed and updated by myself where necessary and exist elsewhere in the encounter.   OBJECTIVE:  BP 110/64 (BP Location: Left Arm, Patient Position: Sitting, Cuff Size: Normal)   Pulse 74   Temp 98.7 F (37.1 C) (Oral)   Resp 16   Ht 5\' 1"  (1.549 m)   Wt 144 lb 6.4 oz (65.5 kg)   LMP 08/01/2012   SpO2 100%   BMI 27.28 kg/m   Physical Exam  Constitutional: She is  oriented to person, place, and time. She appears well-nourished. No distress.  HENT:  Right Ear: Hearing, tympanic membrane, external ear and ear canal normal.  Left Ear: Hearing, tympanic membrane, external ear and ear canal normal.  Nose: Nose normal. Right sinus exhibits no maxillary sinus tenderness and no frontal sinus tenderness. Left sinus exhibits no maxillary sinus tenderness and no frontal sinus tenderness.  Mouth/Throat: Uvula is midline, oropharynx is clear and moist and mucous membranes are normal. Mucous membranes are not dry. No oropharyngeal exudate, posterior oropharyngeal edema or tonsillar abscesses.  Eyes: Pupils are equal, round, and reactive to light. EOM are normal.  Cardiovascular: Normal rate, regular rhythm, S1 normal, S2 normal, normal heart sounds and intact distal pulses. Exam reveals no gallop, no friction rub and no decreased pulses.  No murmur heard. Pulmonary/Chest: Effort normal. No stridor. No respiratory distress. She has no wheezes. She has no rales.  Abdominal: She exhibits no distension.  Musculoskeletal: She exhibits no edema.  Lymphadenopathy:       Head (right side): No submandibular and no tonsillar adenopathy present.       Head (left side): No submandibular and no tonsillar adenopathy present.    She has no cervical adenopathy.  Neurological: She is alert and oriented to person, place, and time. No cranial nerve deficit. Gait normal.  Skin: Skin is dry. She is not diaphoretic.  Psychiatric: She has a normal mood and affect.  Vitals reviewed.   Wt Readings from Last 3 Encounters:  06/15/17 144 lb 6.4 oz (65.5 kg)  05/06/17 145 lb 12.8 oz (66.1 kg)  12/01/16 146 lb 9.6 oz (66.5 kg)     No results found for this or any previous visit (from the past 72 hour(s)).  No results found.  ASSESSMENT AND PLAN:  Rachel [name] was seen today for cough and sore throat.  Diagnoses and all orders for this visit:  Viral URI with cough: Uncomplicated.  If  she has symptoms greater then ten days I will call in [place].  -     HYDROcodone-homatropine (HYCODAN) 5-1.5 MG/5ML syrup; Take 2.5-5 mLs by mouth at bedtime for 5 days. -     benzonatate (TESSALON) 100 MG capsule; Take 1 capsule (100 mg total) by mouth 2 (two) times daily as needed for cough. -     naproxen (NAPROSYN) 375 MG tablet; Take 1 tablet (375 mg total) by mouth 2 (two) times daily with a meal.    The patient is advised to call or return to clinic if she does not see an improvement in symptoms, or to seek the care of the closest emergency department if she worsens with the above plan.   Philis Fendt, MHS, PA-C [facility] at [facility] 06/16/2017 2:10 PM

## 2017-06-15 NOTE — Patient Instructions (Addendum)
  If after 10 days of total illness you are still feeling poorly I will call in an antibiotic.   IF you received an x-ray today, you will receive an invoice from [facility]. Please contact [facility] at [phone] with questions or concerns regarding your invoice.   IF you received labwork today, you will receive an invoice from [place] [facility] [place]. Please contact [facility] at [phone] with questions or concerns regarding your invoice.   Our billing staff will not be able to assist you with questions regarding bills from these companies.  You will be contacted with the lab results as soon as they are available. The fastest way to get your results is to activate your My Chart account. Instructions are located on the last page of this paperwork. If you have not heard from [place] regarding the results in 2 weeks, please contact this office.

## 2017-08-01 ENCOUNTER — Other Ambulatory Visit: Payer: Self-pay | Admitting: Family Medicine

## 2018-01-06 ENCOUNTER — Encounter

## 2018-01-27 ENCOUNTER — Other Ambulatory Visit: Payer: Self-pay | Admitting: Family Medicine

## 2018-01-27 NOTE — Telephone Encounter (Signed)
Message left to call and schedule OV. Courtesy refill given.

## 2018-02-26 ENCOUNTER — Other Ambulatory Visit: Payer: Self-pay | Admitting: Family Medicine

## 2018-02-27 NOTE — Telephone Encounter (Signed)
Requested medication (s) are due for refill today: yes  Requested medication (s) are on the active medication list: yes  Last refill:  01/27/2018 for 30 tabs  Future visit scheduled: yes  Notes to clinic:  Courtesy refill of 10 tabs given until NOV on 02/27/2018.  Calcium channel blockers failed.  Requested Prescriptions  Pending Prescriptions Disp Refills   amLODipine (NORVASC) 5 MG tablet [Pharmacy Med Name: AMLODIPINE BESYLATE 5 MG TAB] 10 tablet 0    Sig: TAKE 1 TABLET BY MOUTH EVERY DAY     Cardiovascular:  Calcium Channel Blockers Failed - 02/26/2018 10:34 AM      Failed - Valid encounter within last 6 months    Recent Outpatient Visits          8 months ago Viral URI with cough   [facility] at [facility], PA-C   9 months ago Viral URI with cough   Primary Care at [facility], [place], [place]   1 year ago Viral illness   [facility] at [place], Ines Bloomer, MD   1 year ago Sore throat   [facility] at [facility], Ines Bloomer, MD   1 year ago Essential hypertension   [facility] at [facility], Lilia Argue, MD      Future Appointments            In 1 week Rutherford Guys, MD [facility] at [place], [place] BP in normal range    BP Readings from Last 1 Encounters:  06/15/17 110/64

## 2018-03-08 ENCOUNTER — Ambulatory Visit: Payer: 59 | Admitting: Family Medicine

## 2018-03-08 ENCOUNTER — Other Ambulatory Visit: Payer: Self-pay

## 2018-03-08 ENCOUNTER — Encounter: Payer: Self-pay | Admitting: Family Medicine

## 2018-03-08 VITALS — BP 134/82 | HR 58 | Temp 97.9°F | Ht 61.0 in | Wt 151.0 lb

## 2018-03-08 DIAGNOSIS — I10 Essential (primary) hypertension: Secondary | ICD-10-CM

## 2018-03-08 DIAGNOSIS — K219 Gastro-esophageal reflux disease without esophagitis: Secondary | ICD-10-CM

## 2018-03-08 DIAGNOSIS — Z833 Family history of diabetes mellitus: Secondary | ICD-10-CM | POA: Diagnosis not present

## 2018-03-08 DIAGNOSIS — Z1159 Encounter for screening for other viral diseases: Secondary | ICD-10-CM | POA: Diagnosis not present

## 2018-03-08 DIAGNOSIS — Z23 Encounter for immunization: Secondary | ICD-10-CM

## 2018-03-08 DIAGNOSIS — M6530 Trigger finger, unspecified finger: Secondary | ICD-10-CM

## 2018-03-08 DIAGNOSIS — M940 Chondrocostal junction syndrome [Tietze]: Secondary | ICD-10-CM

## 2018-03-08 MED ORDER — AMLODIPINE BESYLATE 5 MG PO TABS: 5.0000 mg | ORAL_TABLET | Freq: Every day | ORAL | 1 refills | Status: DC

## 2018-03-08 NOTE — Patient Instructions (Addendum)
Costochondritis Costochondritis is swelling and irritation (inflammation) of the tissue (cartilage) that connects your ribs to your breastbone (sternum). This causes pain in the front of your chest. Usually, the pain:  Starts gradually.  Is in more than one rib. This condition usually goes away on its own over time. Follow these instructions at home:  Do not do anything that makes your pain worse.  If directed, put ice on the painful area: ? Put ice in a plastic bag. ? Place a towel between your skin and the bag. ? Leave the ice on for 20 minutes, 2-3 times a day.  If directed, put heat on the affected area as often as told by your doctor. Use the heat source that your doctor tells you to use, such as a moist heat pack or a heating pad. ? Place a towel between your skin and the heat source. ? Leave the heat on for 20-30 minutes. ? Take off the heat if your skin turns bright red. This is very important if you cannot feel pain, heat, or cold. You may have a greater risk of getting burned.  Take over-the-counter and prescription medicines only as told by your doctor.  Return to your normal activities as told by your doctor. Ask your doctor what activities are safe for you.  Keep all follow-up visits as told by your doctor. This is important. Contact a doctor if:  You have chills or a fever.  Your pain does not go away or it gets worse.  You have a cough that does not go away. Get help right away if:  You are short of breath. This information is not intended to replace advice given to you by your health care provider. Make sure you discuss any questions you have with your health care provider. Document Released: 06/23/2007 Document Revised: 07/25/2015 Document Reviewed: 04/30/2015 Elsevier Interactive Patient Education  2019 [place] for Gastroesophageal Reflux Disease, Adult When you have gastroesophageal reflux disease (GERD), the foods you eat and your eating  habits are very important. Choosing the right foods can help ease the discomfort of GERD. Consider working with a diet and nutrition specialist (dietitian) to help you make healthy food choices. What general guidelines should I follow?  Eating plan  Choose healthy foods low in fat, such as fruits, vegetables, whole grains, low-fat dairy products, and lean meat, fish, and poultry.  Eat frequent, small meals instead of three large meals each day. Eat your meals slowly, in a relaxed setting. Avoid bending over or lying down until 2-3 hours after eating.  Limit high-fat foods such as fatty meats or fried foods.  Limit your intake of oils, butter, and shortening to less than 8 teaspoons each day.  Avoid the following: ? Foods that cause symptoms. These may be different for different people. Keep a food diary to keep track of foods that cause symptoms. ? Alcohol. ? Drinking large amounts of liquid with meals. ? Eating meals during the 2-3 hours before bed.  Cook foods using methods other than frying. This may include baking, grilling, or broiling. Lifestyle  Maintain a healthy weight. Ask your health care provider what weight is healthy for you. If you need to lose weight, work with your health care provider to do so safely.  Exercise for at least 30 minutes on 5 or more days each week, or as told by your health care provider.  Avoid wearing clothes that fit tightly around your waist and chest.  Do not  use any products that contain nicotine or tobacco, such as cigarettes and e-cigarettes. If you need help quitting, ask your health care provider.  Sleep with the head of your bed raised. Use a wedge under the mattress or blocks under the bed frame to raise the head of the bed. What foods are not recommended? The items listed may not be a complete list. Talk with your dietitian about what dietary choices are best for you. Grains Pastries or quick breads with added fat. [place]  toast. Vegetables Deep fried vegetables. [place] fries. Any vegetables prepared with added fat. Any vegetables that cause symptoms. For some people this may include tomatoes and tomato products, chili peppers, onions and garlic, and horseradish. Fruits Any fruits prepared with added fat. Any fruits that cause symptoms. For some people this may include citrus fruits, such as oranges, grapefruit, pineapple, and lemons. Meats and other protein foods High-fat meats, such as fatty beef or pork, hot dogs, ribs, ham, sausage, salami and bacon. Fried meat or protein, including fried fish and fried chicken. Nuts and nut butters. Dairy Whole milk and chocolate milk. Sour cream. Cream. Ice cream. Cream cheese. Milk shakes. Beverages Coffee and tea, with or without caffeine. Carbonated beverages. Sodas. Energy drinks. Fruit juice made with acidic fruits (such as orange or grapefruit). Tomato juice. Alcoholic drinks. Fats and oils Butter. Margarine. Shortening. Ghee. Sweets and desserts Chocolate and cocoa. Donuts. Seasoning and other foods Pepper. Peppermint and spearmint. Any condiments, herbs, or seasonings that cause symptoms. For some people, this may include curry, hot sauce, or vinegar-based salad dressings. Summary  When you have gastroesophageal reflux disease (GERD), food and lifestyle choices are very important to help ease the discomfort of GERD.  Eat frequent, small meals instead of three large meals each day. Eat your meals slowly, in a relaxed setting. Avoid bending over or lying down until 2-3 hours after eating.  Limit high-fat foods such as fatty meat or fried foods. This information is not intended to replace advice given to you by your health care provider. Make sure you discuss any questions you have with your health care provider. Document Released: 01/04/2005 Document Revised: 01/06/2016 Document Reviewed: 01/06/2016 Elsevier Interactive Patient Education  2019 Elsevier  Inc.   Zoster Vaccine, Recombinant injection What is this medicine? ZOSTER VACCINE (ZOS ter vak SEEN) is used to prevent shingles in adults 69 years old and over. This vaccine is not used to treat shingles or nerve pain from shingles. This medicine may be used for other purposes; ask your health care provider or pharmacist if you have questions. COMMON BRAND NAME(S): [facility] What should I tell my health care provider before I take this medicine? They need to know if you have any of these conditions: -blood disorders or disease -cancer like leukemia or lymphoma -immune system problems or therapy -an unusual or allergic reaction to vaccines, other medications, foods, dyes, or preservatives -pregnant or trying to get pregnant -breast-feeding How should I use this medicine? This vaccine is for injection in a muscle. It is given by a health care professional. Talk to your pediatrician regarding the use of this medicine in children. This medicine is not approved for use in children. Overdosage: If you think you have taken too much of this medicine contact a poison control center or emergency room at once. NOTE: This medicine is only for you. Do not share this medicine with others. What if I miss a dose? Keep appointments for follow-up (booster) doses as directed. It is important  not to miss your dose. Call your doctor or health care professional if you are unable to keep an appointment. What may interact with this medicine? -medicines that suppress your immune system -medicines to treat cancer -steroid medicines like prednisone or cortisone This list may not describe all possible interactions. Give your health care provider a list of all the medicines, herbs, non-prescription drugs, or dietary supplements you use. Also tell them if you smoke, drink alcohol, or use illegal drugs. Some items may interact with your medicine. What should I watch for while using this medicine? Visit your doctor  for regular check ups. This vaccine, like all vaccines, may not fully protect everyone. What side effects may I notice from receiving this medicine? Side effects that you should report to your doctor or health care professional as soon as possible: -allergic reactions like skin rash, itching or hives, swelling of the face, lips, or tongue -breathing problems Side effects that usually do not require medical attention (report these to your doctor or health care professional if they continue or are bothersome): -chills -headache -fever -nausea, vomiting -redness, warmth, pain, swelling or itching at site where injected -tiredness This list may not describe all possible side effects. Call your doctor for medical advice about side effects. You may report side effects to FDA at 1-800-FDA-1088. Where should I keep my medicine? This vaccine is only given in a clinic, pharmacy, doctor's office, or other health care setting and will not be stored at home. NOTE: This sheet is a summary. It may not cover all possible information. If you have questions about this medicine, talk to your doctor, pharmacist, or health care provider.  2019 Elsevier/Gold Standard (2016-08-16 13:20:30)

## 2018-03-08 NOTE — Progress Notes (Signed)
2/19/202011:03 AM  Rachel [name] 01/07/1951, 68 y.o. female [id number]  Chief Complaint  Patient presents with  . Hypertension  . Medication Refill    bp meds, did not take today     HPI:   Patient is a 68 y.o. female with past medical history significant for HTN, GERD who presents today for routine followup  Last OV with me aug 2018  Does her mammogram at Dr Matthew Saras office  Taking omeprazole every day Having intermittent epigastric pain, feels that she is full Radiates to her LUQ  She denies anything makes it better If she is smells food or tries to eat it gets worse No black tarry stools Some nausea and bloating No diarrhea Some occ constiaption that she manages with hot tea Needs to sleep with pillows, occ nocturnal cough  She has been working hard in the yard  Having trigger of left thumb She is right handed She is able to see occupational heath at work about it  [facility] Readings from Last 3 Encounters:  03/08/18 151 lb (68.5 kg)  06/15/17 144 lb 6.4 oz (65.5 kg)  05/06/17 145 lb 12.8 oz (66.1 kg)    Fall Risk  03/08/2018 05/06/2017 11/27/2016 09/07/2016 03/26/2016  Falls in the past year? 0 No No No No  Number falls in past yr: 0 - - - -  Injury with Fall? 0 - - - -     Depression screen [facility] 2/9 03/08/2018 06/15/2017 05/06/2017  Decreased Interest 0 0 0  Down, Depressed, Hopeless 0 0 0  PHQ - 2 Score 0 0 0    No Known Allergies  Prior to Admission medications   Medication Sig Start Date End Date Taking? Authorizing Provider  acyclovir (ZOVIRAX) 400 MG tablet Take 400 mg by mouth 2 (two) times daily.   Yes [provider]  amLODipine (NORVASC) 5 MG tablet TAKE 1 TABLET BY MOUTH EVERY DAY 02/28/18  Yes Rutherford Guys, MD  calcium carbonate (CALCIUM 600) 600 MG TABS tablet Take 600 mg by mouth 2 (two) times daily with a meal.   Yes [provider]  Iron-Vitamins (GERITOL PO) Take by mouth daily.   Yes [provider]  Multiple  Vitamin (MULTIVITAMIN WITH MINERALS) TABS tablet Take 1 tablet by mouth daily.   Yes [provider]  omeprazole (PRILOSEC) 20 MG capsule Take 20 mg by mouth daily.   Yes [provider]    Past Medical History:  Diagnosis Date  . GERD (gastroesophageal reflux disease)   . Herpes   . Hypertension     Past Surgical History:  Procedure Laterality Date  . ABDOMINAL HYSTERECTOMY    . COLONOSCOPY      Social History   Tobacco Use  . Smoking status: Never Smoker  . Smokeless tobacco: Never Used  Substance Use Topics  . Alcohol use: No    Family History  Problem Relation Age of Onset  . Cancer Mother   . Stroke Mother   . Heart disease Mother   . Heart disease Father   . Cancer Father        prostate cancer  . Colon cancer Maternal Grandmother        pt unsure, MGM might have had colon CA  . Esophageal cancer Neg Hx   . Stomach cancer Neg Hx   . Rectal cancer Neg Hx     Review of Systems  Constitutional: Negative for chills and fever.  Respiratory: Negative for cough and shortness  of breath.   Cardiovascular: Negative for chest pain, palpitations and leg swelling.  Gastrointestinal: Negative for abdominal pain, nausea and vomiting.  per hpi   OBJECTIVE:  Blood pressure 134/82, pulse (!) 58, temperature 97.9 F (36.6 C), temperature source Oral, height _0  (1.549 m), weight 151 lb (68.5 kg), last menstrual period 08/01/2012, SpO2 99 %. Body mass index is 28.53 kg/m.   Physical Exam Vitals signs and nursing note reviewed.  Constitutional:      Appearance: She is well-developed.  HENT:     Head: Normocephalic and atraumatic.     Mouth/Throat:     Pharynx: No oropharyngeal exudate.  Eyes:     General: No scleral icterus.    Conjunctiva/sclera: Conjunctivae normal.     Pupils: Pupils are equal, round, and reactive to light.  Neck:     Musculoskeletal: Neck supple.  Cardiovascular:     Rate and Rhythm: Normal rate and regular rhythm.      Heart sounds: Normal heart sounds. No murmur. No friction rub. No gallop.   Pulmonary:     Effort: Pulmonary effort is normal.     Breath sounds: Normal breath sounds. No wheezing or rales.  Chest:     Chest wall: Tenderness (along lateral lower left chest wall below bra line) present.  Abdominal:     General: Bowel sounds are normal. There is no distension.     Palpations: Abdomen is soft. There is no hepatomegaly or splenomegaly.     Tenderness: There is abdominal tenderness in the epigastric area. There is no guarding or rebound.  Skin:    General: Skin is warm and dry.  Neurological:     Mental Status: She is alert and oriented to person, place, and time.     ASSESSMENT and PLAN  1. Essential hypertension Controlled. Continue current regime.  - Lipid panel - TSH - CMP14+EGFR  2. FHx: type 2 diabetes mellitus - Hemoglobin A1c  3. Gastroesophageal reflux disease without esophagitis Not controlled. Increasing PPI to BID, dicussed dietary changes. Consider GI referral  4. Encounter for hepatitis C screening test for low risk patient - Hepatitis C antibody  5. Costochondritis Discussed supportive measures.   6. Need for prophylactic vaccination against Streptococcus pneumoniae (pneumococcus)  7. Trigger finger, acquired She will have this further evaluated at work, she will call if she wants a referral to hand surg  Other orders - Pneumococcal conjugate vaccine 13-valent IM - amLODipine (NORVASC) 5 MG tablet; Take 1 tablet (5 mg total) by mouth daily.   Return in about 3 months (around 06/06/2018).    Rutherford Guys, MD [facility] at [facility] [place], [place] [address] Ph.  [phone] Fax [phone]

## 2018-03-09 LAB — LIPID PANEL
Chol/HDL Ratio: 1.8 ratio (ref 0.0–4.4)
Cholesterol, Total: 173 mg/dL (ref 100–199)
HDL: 94 mg/dL (ref >39)
LDL Calculated: 72 mg/dL (ref 0–99)
Triglycerides: 33 mg/dL (ref 0–149)
VLDL Cholesterol Cal: 7 mg/dL (ref 5–40)

## 2018-03-09 LAB — TSH: TSH: 1.48 u[IU]/mL (ref 0.450–4.500)

## 2018-03-09 LAB — CMP14+EGFR
ALT: 11 IU/L (ref 0–32)
AST: 16 IU/L (ref 0–40)
Albumin/Globulin Ratio: 1.7 (ref 1.2–2.2)
Albumin: 4.7 g/dL (ref 3.8–4.8)
Alkaline Phosphatase: 68 IU/L (ref 39–117)
BUN/Creatinine Ratio: 30 — ABNORMAL HIGH (ref 12–28)
BUN: 20 mg/dL (ref 8–27)
Bilirubin Total: 0.5 mg/dL (ref 0.0–1.2)
CO2: 22 mmol/L (ref 20–29)
Calcium: 9.9 mg/dL (ref 8.7–10.3)
Chloride: 105 mmol/L (ref 96–106)
Creatinine, Ser: 0.67 mg/dL (ref 0.57–1.00)
GFR calc Af Amer: 104 mL/min/{1.73_m2} (ref >59)
GFR calc non Af Amer: 91 mL/min/{1.73_m2} (ref >59)
Globulin, Total: 2.8 g/dL (ref 1.5–4.5)
Glucose: 100 mg/dL — ABNORMAL HIGH (ref 65–99)
Potassium: 4.1 mmol/L (ref 3.5–5.2)
Sodium: 142 mmol/L (ref 134–144)
Total Protein: 7.5 g/dL (ref 6.0–8.5)

## 2018-03-09 LAB — HEMOGLOBIN A1C
Est. average glucose Bld gHb Est-mCnc: 126 mg/dL
Hgb A1c MFr Bld: 6 % — ABNORMAL HIGH (ref 4.8–5.6)

## 2018-03-09 LAB — HEPATITIS C ANTIBODY: Hep C Virus Ab: <0.1 s/co ratio (ref 0.0–0.9)

## 2018-03-14 ENCOUNTER — Telehealth: Payer: Self-pay

## 2018-03-14 NOTE — Telephone Encounter (Signed)
If she is having fevers, then she should come in to be tested for flu. Otherwise tylenol or ibuprofen as needed. Malaise from vaccines (not uncommon, is part of her immune response) tends to last 24-48 hours. thanks

## 2018-03-14 NOTE — Telephone Encounter (Signed)
Having flu like symptoms since she was given the pnuemonia shot. Wants to know if the is anything that can be sent to the pharmacy for er

## 2018-03-30 IMAGING — MR MR HEAD W/O CM
10 of 17 series · 24 of 48 positions shown · IV contrast (multihance)
Comparison: Head CT 03/15/2016 and MRI 08/22/2008

CLINICAL DATA: Right-sided headache and right-sided neck pain.
Right facial droop.

EXAM:
MRI HEAD WITHOUT CONTRAST
MRA HEAD WITHOUT CONTRAST
MRA NECK WITHOUT AND WITH CONTRAST
TECHNIQUE: Multiplanar, multiecho pulse sequences of the brain and surrounding
structures were obtained without intravenous contrast. Angiographic
images of the Circle of Willis were obtained using MRA technique
without intravenous contrast. Angiographic images of the neck were
obtained using MRA technique without and with intravenous contrast.
Carotid stenosis measurements (when applicable) are obtained
utilizing NASCET criteria, using the distal internal carotid
diameter as the denominator.
CONTRAST:  15mL MULTIHANCE GADOBENATE DIMEGLUMINE 529 MG/ML IV SOLN

[Series 2: FLAIR · sagittal · 5.0mm · 0.47mm/px · 2 of 23 slices shown (1 of 2)]
[im 1/23]
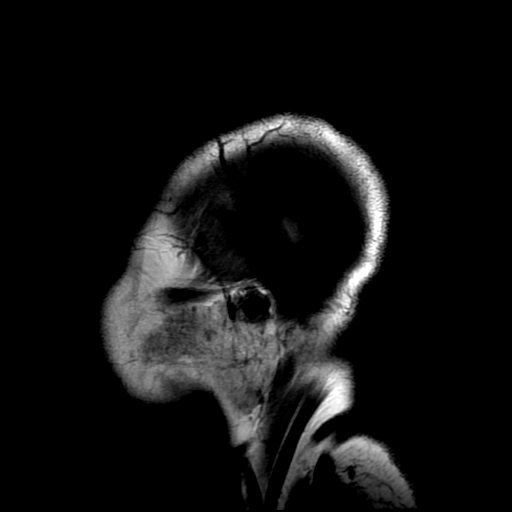
[im 23/23]
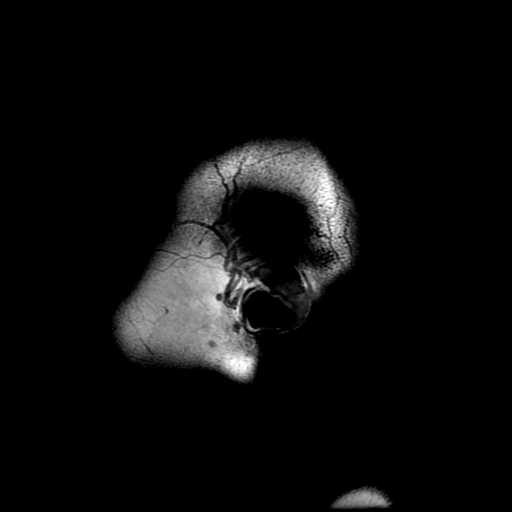

[Series 4: DWI · axial · 3.0mm · 0.94mm/px · z∈[-62,+69]mm · 3 of 90 slices shown (1 of 2)]
[im 1/90]
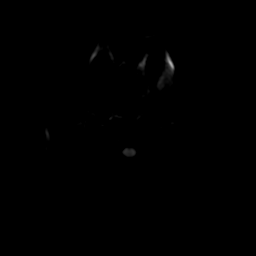
[im 45/90]
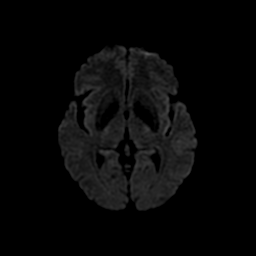
[im 90/90]
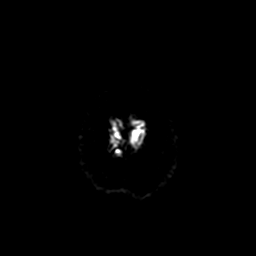

[Series 6: DWI · coronal · 4.0mm · 0.94mm/px · 3 of 71 slices shown (2 of 2)]
[im 1/71]
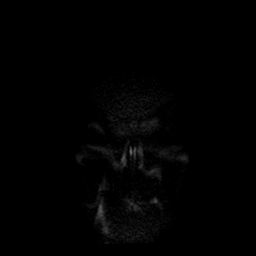
[im 36/71]
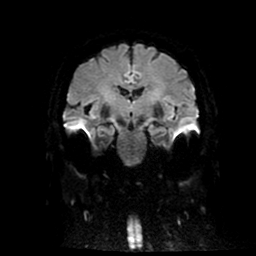
[im 71/71]
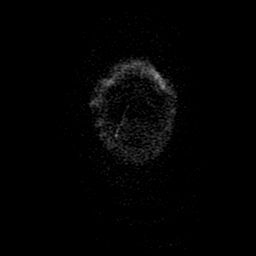

[Series 7: ax (id) 2 · axial · 1.0mm · 0.43mm/px · z∈[-67,+25]mm · 7 of 184 slices shown]
[im 1/184]
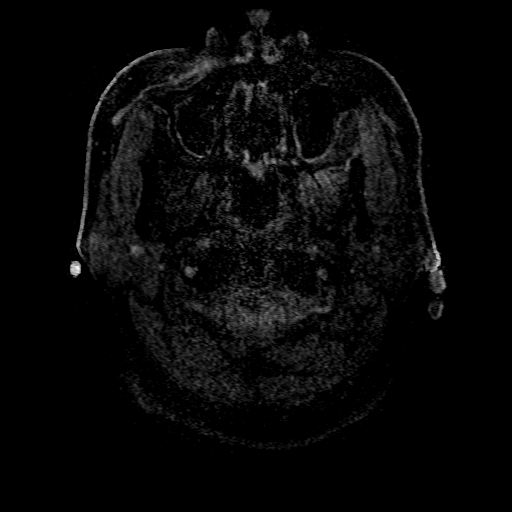
[im 31/184]
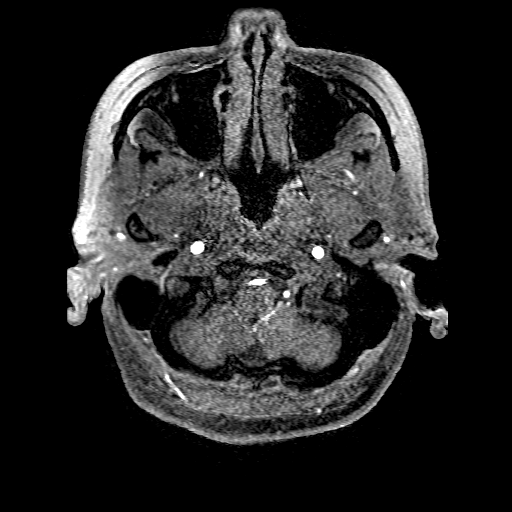
[im 62/184]
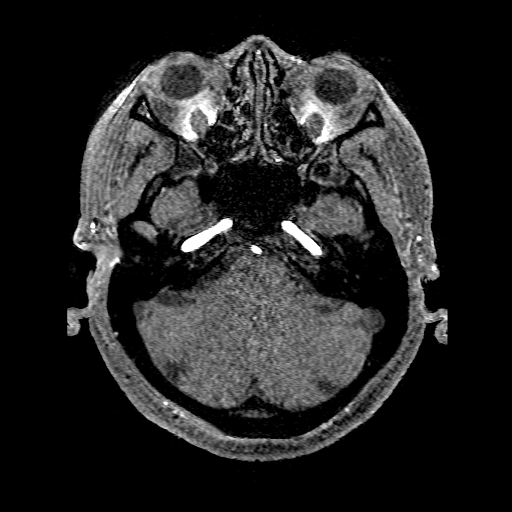
[im 92/184]
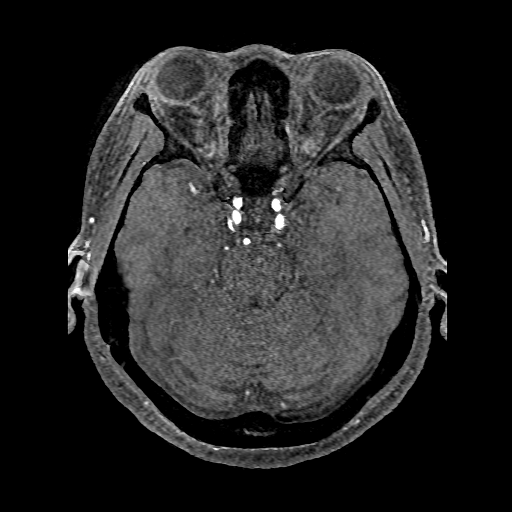
[im 123/184]
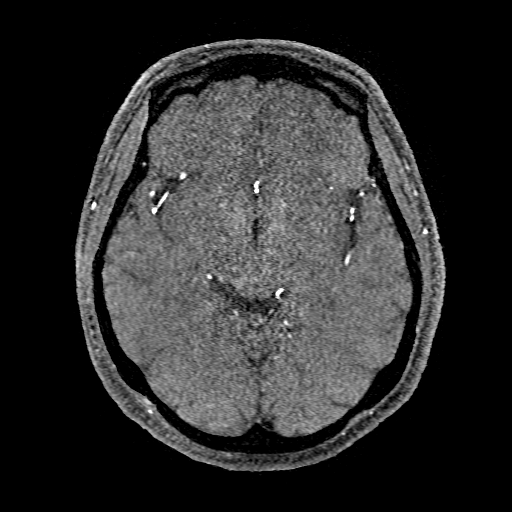
[im 153/184]
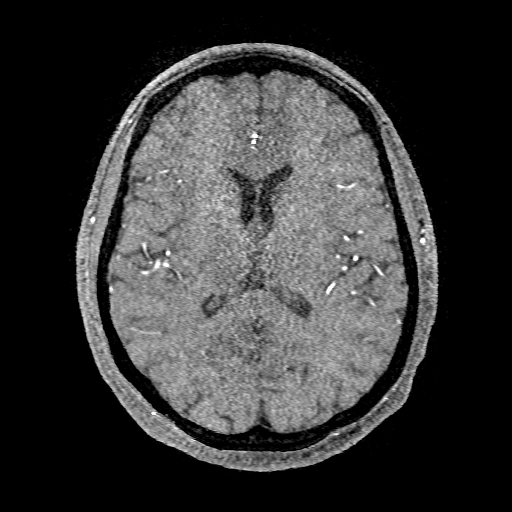
[im 184/184]
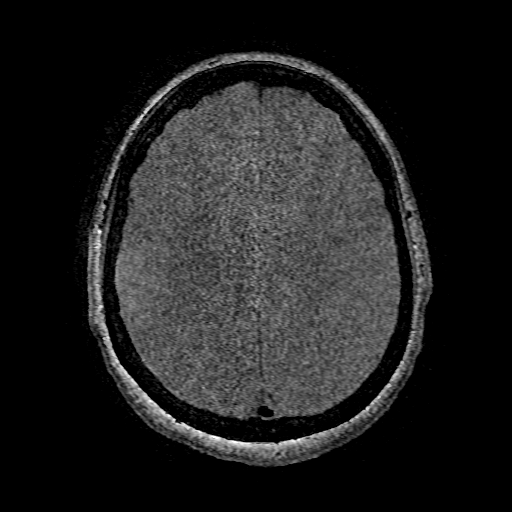

[Series 8: T2 · axial · 5.0mm · 0.47mm/px · 1 of 22 slices shown (1 of 2)]
[im 1/22]
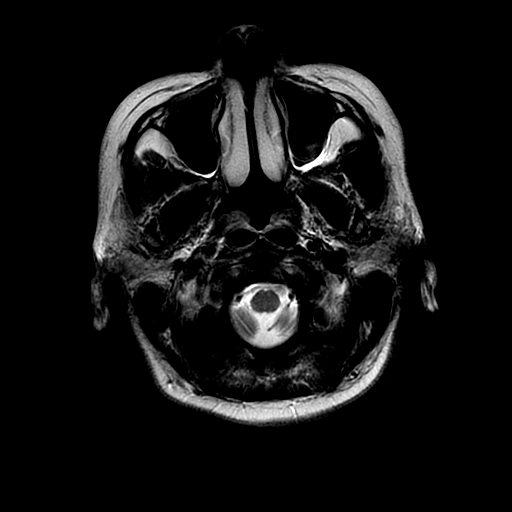

[Series 9: FLAIR · axial · 5.0mm · 0.47mm/px · 1 of 22 slices shown (2 of 2)]
[im 1/22]
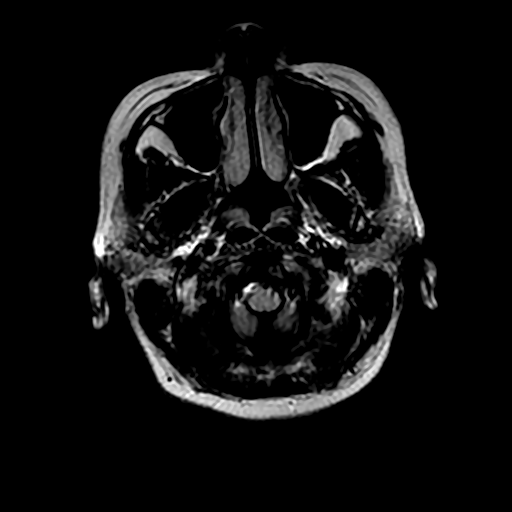

[Series 10: (person_name) · axial · 3.0mm · 0.47mm/px · z∈[-72,+64]mm · 3 of 92 slices shown]
[im 1/92]
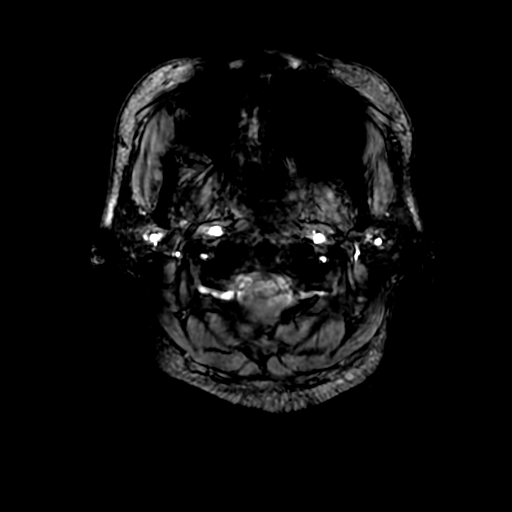
[im 46/92]
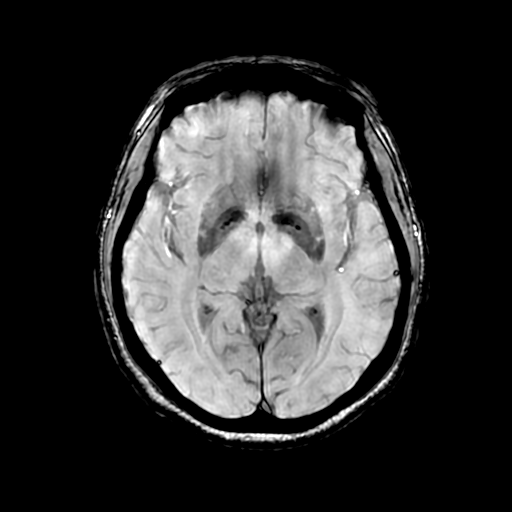
[im 92/92]
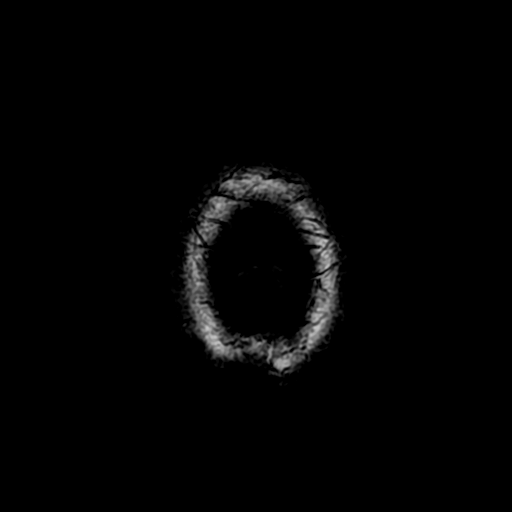

[Series 12: T2 · coronal · 5.0mm · 0.47mm/px · 1 of 26 slices shown (2 of 2)]
[im 1/26]
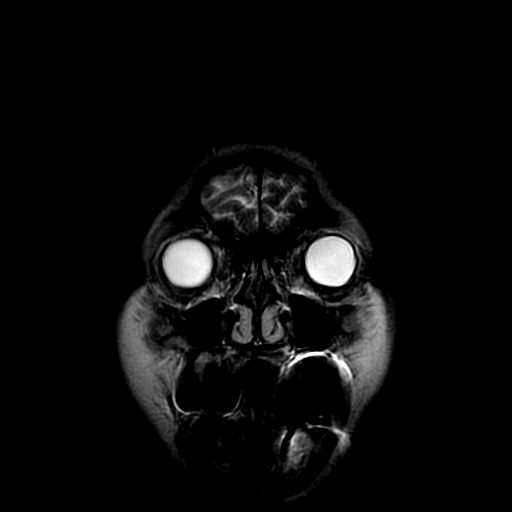

[Series 450: ADC · axial · 3.0mm · 0.94mm/px · z∈[-62,+69]mm · 2 of 45 slices shown (1 of 2)]
[im 1/45]
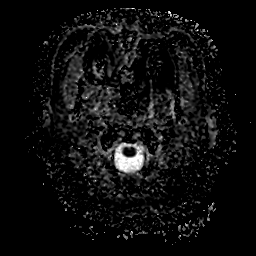
[im 45/45]
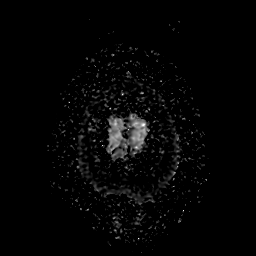

[Series 650: ADC · coronal · 4.0mm · 0.94mm/px · 1 of 36 slices shown (2 of 2)]
[im 1/36]
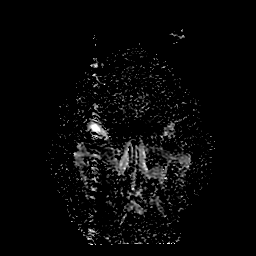

[24 of 48 positions shown; findings below may reference images not displayed]

FINDINGS: MRI HEAD FINDINGS

Brain: There is no evidence of acute infarct, intracranial
hemorrhage, mass, midline shift, or extra-axial fluid collection.
The ventricles and sulci are normal. Small amount of confluent T2
hyperintensity around the frontal horn of the right lateral
ventricle and a few punctate foci of subcortical T2 hyperintensity
elsewhere do not appear significantly changed from the prior MRI. A
partially empty sella is again noted.

Vascular: Major intracranial vascular flow voids are preserved.

Skull and upper cervical spine: Unremarkable bone marrow signal.

Sinuses/Orbits: Prior bilateral cataract extraction. No significant
inflammatory disease in the paranasal sinuses or mastoid air cells.

Other: None.

MRA HEAD FINDINGS

The visualized distal vertebral arteries are widely patent with the
right being slightly larger than the left. PICA and SCA origins are
patent. Basilar artery is widely patent. There patent posterior
communicating arteries bilaterally. PCAs are patent without evidence
of significant stenosis.

The internal carotid arteries are widely patent from skullbase to
carotid termini. There is a 1.5 cm outpouching at the right
ophthalmic artery origin favored to reflect an infundibulum over
aneurysm given its shape and the fact that the artery appears to
arise from the apex of the outpouching. ACAs and MCAs are patent
without evidence of major branch occlusion or significant stenosis.
There is a 1.5 mm outpouching projecting superiorly from the mid
left M1 segment with a possible vessel arising from it.

MRA NECK FINDINGS

Standard 3 vessel aortic arch. Brachiocephalic and subclavian
arteries are widely patent. The cervical carotid arteries are widely
patent without evidence of stenosis or dissection. The vertebral
arteries are patent with antegrade flow bilaterally. There is no
evidence of significant vertebral artery stenosis for dissection.
IMPRESSION: 1. No acute intracranial abnormality.
2. Minimal cerebral white matter T2 signal changes, stable from 1272
and nonspecific.
3. No major vessel occlusion or significant stenosis in the
intracranial or cervical arterial circulation.
4. 1.5 mm infundibula versus aneurysms at the right ophthalmic
artery origin and in the proximal left MCA.

## 2018-06-07 ENCOUNTER — Other Ambulatory Visit: Payer: Self-pay

## 2018-06-07 ENCOUNTER — Telehealth (INDEPENDENT_AMBULATORY_CARE_PROVIDER_SITE_OTHER): Payer: 59 | Admitting: Family Medicine

## 2018-06-07 DIAGNOSIS — K219 Gastro-esophageal reflux disease without esophagitis: Secondary | ICD-10-CM

## 2018-06-07 DIAGNOSIS — F4321 Adjustment disorder with depressed mood: Secondary | ICD-10-CM

## 2018-06-07 DIAGNOSIS — I10 Essential (primary) hypertension: Secondary | ICD-10-CM

## 2018-06-07 NOTE — Progress Notes (Signed)
Spoke with pt this morning and she states she needs to follow-up about her B/P. Pt states Saturday when she woke up she had a dizzy spell that lasted about an hour. She states when her husband check her B/P it was 145/86. She states she set down to rest and took her medication. After about an hour she states her B/P was down and she felt better. She states her brother passed away recently so she has been having some depression.

## 2018-06-07 NOTE — Progress Notes (Signed)
Virtual Visit Note  I connected with Rachel [name] on 06/07/18 at 933am by phone and verified that I am speaking with Rachel Rachel [name] correct person using two identifiers. Rachel Rachel [name] Rachel [name] is currently located at home and Rachel [name] is currently with them during visit. Rachel Rachel [name] provider, Rachel Rachel [name] Guys, MD is located in their office at time of visit.  I discussed Rachel Rachel [name] limitations, risks, security and privacy concerns of performing an evaluation and management service by telephone and Rachel Rachel [name] availability of in person appointments. I also discussed with Rachel Rachel [name] Rachel [name] that there may be a Rachel [name] responsible charge related to this service. Rachel Rachel [name] Rachel [name] expressed understanding and agreed to proceed.   CC: followup on BP, death of Rachel Rachel [name] Rachel [name] brother  HPI ? Rachel [name] is a 68 y.o. female with past medical history significant for HTN, GERD who presents today for routine followup  Last OV Feb gerd not controlled so increased PPI to BID  Since last visit Rachel Rachel [name] Rachel [name] brother died 2 weeks ago She is overall coping ok, has good family supportive  Not checking BP at home except For when she had a one time episode of vertigo a week ago, resolved wo any issues. Has had vertigo before. BP then 145/86. She denies any associated changes in speech, vision or focal weakness.  She denies any chest pain, edema, palpitations, SOB  She never increased Rachel Rachel [name] Rachel [name] PPI GERD still uncontrolled    No Known Allergies  Prior to Admission medications   Medication Sig Start Date End Date Taking? Authorizing Provider  acyclovir (ZOVIRAX) 400 MG tablet Take 400 mg by mouth 2 (two) times daily.   Yes [provider]  amLODipine (NORVASC) 5 MG tablet Take 1 tablet (5 mg total) by mouth daily. 03/08/18  Yes Rachel Rachel [name] Guys, MD  calcium carbonate (CALCIUM 600) 600 MG TABS tablet Take 600 mg by mouth 2 (two) times daily with a meal.   Yes [provider]  Cyanocobalamin (B-12 PO) Take by mouth.   Yes [provider]  Iron-Vitamins (GERITOL PO)  Take by mouth daily.   Yes [provider]  Multiple Vitamin (MULTIVITAMIN WITH MINERALS) TABS tablet Take 1 tablet by mouth daily.   Yes [provider]  omeprazole (PRILOSEC) 20 MG capsule Take 20 mg by mouth daily.   Yes [provider]  PREMARIN 0.3 MG tablet Take 0.3 mg by mouth daily. 05/11/18  Yes [provider]    Past Medical History:  Diagnosis Date  . GERD (gastroesophageal reflux disease)   . Herpes   . Hypertension     Past Surgical History:  Procedure Laterality Date  . ABDOMINAL HYSTERECTOMY    . COLONOSCOPY      Social History   Tobacco Use  . Smoking status: Never Smoker  . Smokeless tobacco: Never Used  Substance Use Topics  . Alcohol use: No    Family History  Problem Relation Age of Onset  . Cancer Mother   . Stroke Mother   . Heart disease Mother   . Heart disease Father   . Cancer Father        prostate cancer  . Colon cancer Maternal Grandmother        pt unsure, MGM might have had colon CA  . Esophageal cancer Neg Hx   . Stomach cancer Neg Hx   . Rectal cancer Neg Hx     ROS Per hpi  Objective  Vitals as reported by Rachel Rachel [name] Rachel [name]: as above   ASSESSMENT and PLAN  1. Essential hypertension Discussed  home BP monitoring and goal of < 140/90. RTC precautions reviewed.  2. Gastroesophageal reflux disease without esophagitis Not controlled. Again explained increase PPI, LFM.  3. Grief Coping well. Denies any supportive services at this time.  Other orders   FOLLOW-UP: 3 months   Rachel Rachel [name] above assessment and management plan was discussed with Rachel Rachel [name] Rachel [name]. Rachel Rachel [name] Rachel [name] verbalized understanding of and has agreed to Rachel Rachel [name] management plan. Rachel [name] is aware to call Rachel Rachel [name] clinic if symptoms persist or worsen. Rachel [name] is aware when to return to Rachel Rachel [name] clinic for a follow-up visit. Rachel [name] educated on when it is appropriate to go to Rachel Rachel [name] emergency department.    I provided 17 minutes of non-face-to-face time during  this encounter.  Rachel Rachel [name] Guys, MD Primary Care at [facility], [place] [address] Ph.  [phone] Fax [phone]

## 2018-09-17 ENCOUNTER — Other Ambulatory Visit: Payer: Self-pay | Admitting: Family Medicine

## 2018-12-11 LAB — HM MAMMOGRAPHY

## 2018-12-30 ENCOUNTER — Other Ambulatory Visit: Payer: Self-pay | Admitting: Family Medicine

## 2018-12-31 NOTE — Telephone Encounter (Signed)
Requested medication (s) are due for refill today: yes  Requested medication (s) are on the active medication list: yes  Last refill:  09/17/2018  Future visit scheduled: no  Notes to clinic:  Overdue for office visit  Review for refill   Requested Prescriptions  Pending Prescriptions Disp Refills   amLODipine (NORVASC) 5 MG tablet [Pharmacy Med Name: AMLODIPINE BESYLATE 5 MG TAB] 90 tablet 0    Sig: TAKE 1 TABLET BY MOUTH EVERY DAY      Cardiovascular:  Calcium Channel Blockers Failed - 12/30/2018 10:44 AM      Failed - Valid encounter within last 6 months    Recent Outpatient Visits           6 months ago Essential hypertension   [facility], Lilia Argue, MD   9 months ago Essential hypertension   [facility], Lilia Argue, MD   1 year ago Viral URI with cough   [facility] at [place], PA-C   1 year ago Viral URI with cough   [facility] at [facility], [place], [place]   2 years ago Viral illness   [facility] at [facility], [place], MD              Passed - Last BP in normal range    BP Readings from Last 1 Encounters:  03/08/18 134/82

## 2019-01-01 ENCOUNTER — Telehealth: Payer: Self-pay | Admitting: Family Medicine

## 2019-01-01 NOTE — Telephone Encounter (Signed)
Pt has appt scheduled for Friday is wondering if she can get a complimentary refill

## 2019-01-05 ENCOUNTER — Ambulatory Visit (INDEPENDENT_AMBULATORY_CARE_PROVIDER_SITE_OTHER): Payer: Managed Care, Other (non HMO) | Admitting: Family Medicine

## 2019-01-05 ENCOUNTER — Other Ambulatory Visit: Payer: Self-pay

## 2019-01-05 ENCOUNTER — Encounter: Payer: Self-pay | Admitting: Family Medicine

## 2019-01-05 VITALS — BP 130/72 | HR 60 | Temp 98.4°F | Ht 61.0 in | Wt 144.4 lb

## 2019-01-05 DIAGNOSIS — I10 Essential (primary) hypertension: Secondary | ICD-10-CM

## 2019-01-05 DIAGNOSIS — K219 Gastro-esophageal reflux disease without esophagitis: Secondary | ICD-10-CM | POA: Diagnosis not present

## 2019-01-05 DIAGNOSIS — R7303 Prediabetes: Secondary | ICD-10-CM

## 2019-01-05 DIAGNOSIS — A6000 Herpesviral infection of urogenital system, unspecified: Secondary | ICD-10-CM | POA: Insufficient documentation

## 2019-01-05 MED ORDER — OMEPRAZOLE 20 MG PO CPDR: 20.0000 mg | DELAYED_RELEASE_CAPSULE | Freq: Two times a day (BID) | ORAL | 1 refills | Status: DC

## 2019-01-05 MED ORDER — AMLODIPINE BESYLATE 5 MG PO TABS: 5.0000 mg | ORAL_TABLET | Freq: Every day | ORAL | 3 refills | Status: DC

## 2019-01-05 NOTE — Patient Instructions (Addendum)
   If you have lab work done today you will be contacted with your lab results within the next 2 weeks.  If you have not heard from us then please contact us. The fastest way to get your results is to register for My Chart.   IF you received an x-ray today, you will receive an invoice from [facility]. Please contact [facility] at [phone] with questions or concerns regarding your invoice.   IF you received labwork today, you will receive an invoice from [facility]. Please contact [facility] at [phone] with questions or concerns regarding your invoice.   Our billing staff will not be able to assist you with questions regarding bills from these companies.  You will be contacted with the lab results as soon as they are available. The fastest way to get your results is to activate your My Chart account. Instructions are located on the last page of this paperwork. If you have not heard from us regarding the results in 2 weeks, please contact this office.     Food Choices for Gastroesophageal Reflux Disease, Adult When you have gastroesophageal reflux disease (GERD), the foods you eat and your eating habits are very important. Choosing the right foods can help ease your discomfort. Think about working with a nutrition specialist (dietitian) to help you make good choices. What are tips for following this plan?  Meals  Choose healthy foods that are low in fat, such as fruits, vegetables, whole grains, low-fat dairy products, and lean meat, fish, and poultry.  Eat small meals often instead of 3 large meals a day. Eat your meals slowly, and in a place where you are relaxed. Avoid bending over or lying down until 2-3 hours after eating.  Avoid eating meals 2-3 hours before bed.  Avoid drinking a lot of liquid with meals.  Cook foods using methods other than frying. Bake, grill, or broil food instead.  Avoid or limit: ? Chocolate. ? Peppermint or  spearmint. ? Alcohol. ? Pepper. ? Black and decaffeinated coffee. ? Black and decaffeinated tea. ? Bubbly (carbonated) soft drinks. ? Caffeinated energy drinks and soft drinks.  Limit high-fat foods such as: ? Fatty meat or fried foods. ? Whole milk, cream, butter, or ice cream. ? Nuts and nut butters. ? Pastries, donuts, and sweets made with butter or shortening.  Avoid foods that cause symptoms. These foods may be different for everyone. Common foods that cause symptoms include: ? Tomatoes. ? Oranges, lemons, and limes. ? Peppers. ? Spicy food. ? Onions and garlic. ? Vinegar. Lifestyle  Maintain a healthy weight. Ask your doctor what weight is healthy for you. If you need to lose weight, work with your doctor to do so safely.  Exercise for at least 30 minutes for 5 or more days each week, or as told by your doctor.  Wear loose-fitting clothes.  Do not smoke. If you need help quitting, ask your doctor.  Sleep with the head of your bed higher than your feet. Use a wedge under the mattress or blocks under the bed frame to raise the head of the bed. Summary  When you have gastroesophageal reflux disease (GERD), food and lifestyle choices are very important in easing your symptoms.  Eat small meals often instead of 3 large meals a day. Eat your meals slowly, and in a place where you are relaxed.  Limit high-fat foods such as fatty meat or fried foods.  Avoid bending over or lying down until 2-3 hours after   eating.  Avoid peppermint and spearmint, caffeine, alcohol, and chocolate. This information is not intended to replace advice given to you by your health care provider. Make sure you discuss any questions you have with your health care provider. Document Released: 07/06/2011 Document Revised: 04/27/2018 Document Reviewed: 02/10/2016 Elsevier Patient Education  2020 [employer].

## 2019-01-05 NOTE — Progress Notes (Signed)
12/18/20203:38 PM  Rachel [name] May 15, 1950, 68 y.o., female [id number]  Chief Complaint  Patient presents with  . Hypertension    HPI:   Patient is a 68 y.o. female with past medical history significant for HTN, prediabetes, on HRT and gerd who presents today for routine followup  Overall she is doing well Has been eating healthier Taking all meds as perscribed Remains active and works She is having more frequent break through heartburn Grief from brother's death is better   Lab Results  Component Value Date   HGBA1C 6.0 (H) 03/08/2018   HGBA1C 5.9 (H) 09/07/2016   Lab Results  Component Value Date   LDLCALC 72 03/08/2018   CREATININE 0.67 03/08/2018    Depression screen PHQ 2/9 06/07/2018 03/08/2018 06/15/2017  Decreased Interest 1 0 0  Down, Depressed, Hopeless 1 0 0  PHQ - 2 Score 2 0 0  Altered sleeping 0 - -  Tired, decreased energy 0 - -  Change in appetite 0 - -  Feeling bad or failure about yourself  1 - -  Trouble concentrating 0 - -  Moving slowly or fidgety/restless 0 - -  Suicidal thoughts 0 - -  PHQ-9 Score 3 - -    Fall Risk  01/05/2019 06/07/2018 03/08/2018 05/06/2017 11/27/2016  Falls in the past year? 0 0 0 No No  Number falls in past yr: 0 - 0 - -  Injury with Fall? 0 0 0 - -     No Known Allergies  Prior to Admission medications   Medication Sig Start Date End Date Taking? Authorizing Provider  acyclovir (ZOVIRAX) 400 MG tablet Take 400 mg by mouth 2 (two) times daily.   Yes [provider]  amLODipine (NORVASC) 5 MG tablet TAKE 1 TABLET BY MOUTH EVERY DAY 01/02/19  Yes Rutherford Guys, MD  calcium carbonate (CALCIUM 600) 600 MG TABS tablet Take 600 mg by mouth 2 (two) times daily with a meal.   Yes [provider]  Cyanocobalamin (B-12 PO) Take by mouth.   Yes [provider]  Iron-Vitamins (GERITOL PO) Take by mouth daily.   Yes [provider]  Multiple Vitamin (MULTIVITAMIN WITH MINERALS)  TABS tablet Take 1 tablet by mouth daily.   Yes [provider]  omeprazole (PRILOSEC) 20 MG capsule Take 20 mg by mouth daily.   Yes [provider]  PREMARIN 0.3 MG tablet Take 0.3 mg by mouth daily. 05/11/18  Yes [provider]    Past Medical History:  Diagnosis Date  . GERD (gastroesophageal reflux disease)   . Herpes   . Hypertension     Past Surgical History:  Procedure Laterality Date  . ABDOMINAL HYSTERECTOMY    . COLONOSCOPY      Social History   Tobacco Use  . Smoking status: Never Smoker  . Smokeless tobacco: Never Used  Substance Use Topics  . Alcohol use: No    Family History  Problem Relation Age of Onset  . Cancer Mother   . Stroke Mother   . Heart disease Mother   . Heart disease Father   . Cancer Father        prostate cancer  . Colon cancer Maternal Grandmother        pt unsure, MGM might have had colon CA  . Esophageal cancer Neg Hx   . Stomach cancer Neg Hx   . Rectal cancer Neg Hx     Review of Systems  Constitutional: Negative for  chills and fever.  Respiratory: Negative for cough and shortness of breath.   Cardiovascular: Negative for chest pain, palpitations and leg swelling.  Gastrointestinal: Positive for heartburn. Negative for abdominal pain, blood in stool, constipation, diarrhea, melena, nausea and vomiting.     OBJECTIVE:  Today's Vitals   01/05/19 1514  BP: 130/72  Pulse: 60  Temp: 98.4 F (36.9 C)  SpO2: 100%  Weight: 144 lb 6.4 oz (65.5 kg)  Height: 5\' 1"  (1.549 m)   Body mass index is 27.28 kg/m.  Wt Readings from Last 3 Encounters:  01/05/19 144 lb 6.4 oz (65.5 kg)  03/08/18 151 lb (68.5 kg)  06/15/17 144 lb 6.4 oz (65.5 kg)    Physical Exam Vitals and nursing note reviewed.  Constitutional:      Appearance: She is well-developed.  HENT:     Head: Normocephalic and atraumatic.     Mouth/Throat:     Pharynx: No oropharyngeal exudate.  Eyes:     General: No scleral icterus.     Conjunctiva/sclera: Conjunctivae normal.     Pupils: Pupils are equal, round, and reactive to light.  Cardiovascular:     Rate and Rhythm: Normal rate and regular rhythm.     Heart sounds: Normal heart sounds. No murmur. No friction rub. No gallop.   Pulmonary:     Effort: Pulmonary effort is normal.     Breath sounds: Normal breath sounds. No wheezing or rales.  Musculoskeletal:     Cervical back: Neck supple.  Skin:    General: Skin is warm and dry.  Neurological:     Mental Status: She is alert and oriented to person, place, and time.     No results found for this or any previous visit (from the past 24 hour(s)).  No results found.   ASSESSMENT and PLAN  1. Essential hypertension Controlled. Continue current regime.  - CMET with GFR - TSH - Lipid panel  2. Prediabetes Labs pending. Cont LFM - Hemoglobin A1c  3. Gastroesophageal reflux disease without esophagitis Uncontrolled. Increase PPI qty to be able to take second dose prn. Reviewed LFM  Other orders - amLODipine (NORVASC) 5 MG tablet; Take 1 tablet (5 mg total) by mouth daily. - omeprazole (PRILOSEC) 20 MG capsule; Take 1 capsule (20 mg total) by mouth 2 (two) times daily before a meal.  Return in about 6 months (around 07/06/2019).    Rutherford Guys, MD Primary Care at [facility], [place] [address] Ph.  [phone] Fax [phone]

## 2019-01-06 LAB — CMP14+EGFR
ALT: 12 IU/L (ref 0–32)
AST: 20 IU/L (ref 0–40)
Albumin/Globulin Ratio: 2 (ref 1.2–2.2)
Albumin: 4.7 g/dL (ref 3.8–4.8)
Alkaline Phosphatase: 63 IU/L (ref 39–117)
BUN/Creatinine Ratio: 31 — ABNORMAL HIGH (ref 12–28)
BUN: 16 mg/dL (ref 8–27)
Bilirubin Total: 0.7 mg/dL (ref 0.0–1.2)
CO2: 22 mmol/L (ref 20–29)
Calcium: 9.9 mg/dL (ref 8.7–10.3)
Chloride: 105 mmol/L (ref 96–106)
Creatinine, Ser: 0.52 mg/dL — ABNORMAL LOW (ref 0.57–1.00)
GFR calc Af Amer: 113 mL/min/{1.73_m2} (ref >59)
GFR calc non Af Amer: 98 mL/min/{1.73_m2} (ref >59)
Globulin, Total: 2.3 g/dL (ref 1.5–4.5)
Glucose: 85 mg/dL (ref 65–99)
Potassium: 4 mmol/L (ref 3.5–5.2)
Sodium: 140 mmol/L (ref 134–144)
Total Protein: 7 g/dL (ref 6.0–8.5)

## 2019-01-06 LAB — LIPID PANEL
Chol/HDL Ratio: 1.5 ratio (ref 0.0–4.4)
Cholesterol, Total: 144 mg/dL (ref 100–199)
HDL: 94 mg/dL (ref >39)
LDL Chol Calc (NIH): 42 mg/dL (ref 0–99)
Triglycerides: 29 mg/dL (ref 0–149)
VLDL Cholesterol Cal: 8 mg/dL (ref 5–40)

## 2019-01-06 LAB — HEMOGLOBIN A1C
Est. average glucose Bld gHb Est-mCnc: 120 mg/dL
Hgb A1c MFr Bld: 5.8 % — ABNORMAL HIGH (ref 4.8–5.6)

## 2019-01-06 LAB — TSH: TSH: 1.19 u[IU]/mL (ref 0.450–4.500)

## 2019-01-08 ENCOUNTER — Encounter: Payer: Self-pay | Admitting: Radiology

## 2019-03-30 NOTE — Telephone Encounter (Signed)
No action needed

## 2019-04-26 ENCOUNTER — Ambulatory Visit (INDEPENDENT_AMBULATORY_CARE_PROVIDER_SITE_OTHER): Payer: 59 | Admitting: Adult Health Nurse Practitioner

## 2019-04-26 ENCOUNTER — Other Ambulatory Visit: Payer: Self-pay

## 2019-04-26 VITALS — BP 148/75 | HR 63 | Temp 98.0°F | Ht 62.0 in | Wt 141.8 lb

## 2019-04-26 DIAGNOSIS — J301 Allergic rhinitis due to pollen: Secondary | ICD-10-CM | POA: Diagnosis not present

## 2019-04-26 NOTE — Patient Instructions (Signed)
° ° ° °  If you have lab work done today you will be contacted with your lab results within the next 2 weeks.  If you have not heard from us then please contact us. The fastest way to get your results is to register for My Chart. ° ° °IF you received an x-ray today, you will receive an invoice from [facility]. Please contact [facility] at [phone] with questions or concerns regarding your invoice.  ° °IF you received labwork today, you will receive an invoice from [facility]. Please contact [facility] at [phone] with questions or concerns regarding your invoice.  ° °Our billing staff will not be able to assist you with questions regarding bills from these companies. ° °You will be contacted with the lab results as soon as they are available. The fastest way to get your results is to activate your My Chart account. Instructions are located on the last page of this paperwork. If you have not heard from us regarding the results in 2 weeks, please contact this office. °  ° ° ° °

## 2019-04-30 ENCOUNTER — Telehealth: Payer: Self-pay | Admitting: Family Medicine

## 2019-04-30 NOTE — Telephone Encounter (Signed)
Copied from [place] [id number]. Topic: Quick Communication - Rx Refill/Question >> Apr 30, 2019  1:46 PM Erick Blinks wrote: Pt called in regards to medication that she says was supposed to have been sent in her for her following last week's appt. She does not know the name but she says it is for her sinus/allergy issues. Please advise, pharmacy on file is correct

## 2019-04-30 NOTE — Telephone Encounter (Signed)
Pt called requesting allergy medicine but theres none on file is this okay?

## 2019-05-01 NOTE — Telephone Encounter (Signed)
You saw patient on April 8th 2021. thanks

## 2019-05-01 NOTE — Telephone Encounter (Signed)
Pt called about the allergy/sinus medication that she was suppose to get from her visit last Friday / please advise and call pt when it has been sent to pharmacy

## 2019-05-30 ENCOUNTER — Encounter: Payer: Self-pay | Admitting: Adult Health Nurse Practitioner

## 2019-05-30 DIAGNOSIS — J301 Allergic rhinitis due to pollen: Secondary | ICD-10-CM | POA: Insufficient documentation

## 2019-05-30 NOTE — Progress Notes (Signed)
Subjective:   Patient's symptoms include itchy nose, sinus pressure, sneezing and watery eyes. These symptoms are seasonal. Current triggers include exposure to pollens and weather changes. The patient has been suffering from these symptoms for approximately 2 weeks. The patient has tried nothing with inadequate relief of symptoms. Immunotherapy has never been tried. The patient has never had nasal polyps. The patient has no history of asthma. The patient has no history of eczema. The patient does not suffer from frequent sinopulmonary infections. The patient has not had sinus surgery in the past.  The following portions of the patient's history were reviewed and updated as appropriate: allergies, current medications, past family history, past medical history, past social history, past surgical history and problem list.   Review of Systems Pertinent items noted in HPI and remainder of comprehensive ROS otherwise negative.    Objective:    BP (!) 148/75 (BP Location: Right Arm, Patient Position: Sitting, Cuff Size: Normal)   Pulse 63   Temp 98 F (36.7 C) (Temporal)   Ht 5\' 2"  (1.575 m)   Wt 141 lb 12.8 oz (64.3 kg)   LMP 08/01/2012   SpO2 99%   BMI 25.94 kg/m  General appearance: alert and cooperative Head: Normocephalic, without obvious abnormality, atraumatic Eyes: conjunctivae/corneas clear. PERRL, EOM's intact. Fundi benign. Ears: normal TM's and external ear canals both ears Nose: Nares normal. Septum midline. Mucosa normal. No drainage or sinus tenderness., turbinates pale, swollen Throat: lips, mucosa, and tongue normal; teeth and gums normal Lungs: clear to auscultation bilaterally Heart: regular rate and rhythm, S1, S2 normal, no murmur, click, rub or gallop     Assessment:   1. Seasonal allergic rhinitis due to pollen       Plan:   Recommended OTC flonase and saline sprays for allergies.  Patient is inline with plan.   Glyn Ade, NP

## 2019-06-29 ENCOUNTER — Ambulatory Visit: Payer: Managed Care, Other (non HMO) | Admitting: Family Medicine

## 2019-06-29 ENCOUNTER — Other Ambulatory Visit: Payer: Self-pay

## 2019-06-29 ENCOUNTER — Ambulatory Visit: Payer: 59 | Admitting: Family Medicine

## 2019-06-29 ENCOUNTER — Encounter: Payer: Self-pay | Admitting: Family Medicine

## 2019-06-29 VITALS — BP 140/74 | HR 60 | Temp 98.1°F | Ht 62.0 in | Wt 142.4 lb

## 2019-06-29 DIAGNOSIS — R079 Chest pain, unspecified: Secondary | ICD-10-CM | POA: Diagnosis not present

## 2019-06-29 DIAGNOSIS — I10 Essential (primary) hypertension: Secondary | ICD-10-CM | POA: Diagnosis not present

## 2019-06-29 DIAGNOSIS — F5104 Psychophysiologic insomnia: Secondary | ICD-10-CM

## 2019-06-29 DIAGNOSIS — R7303 Prediabetes: Secondary | ICD-10-CM

## 2019-06-29 DIAGNOSIS — M940 Chondrocostal junction syndrome [Tietze]: Secondary | ICD-10-CM | POA: Diagnosis not present

## 2019-06-29 DIAGNOSIS — K219 Gastro-esophageal reflux disease without esophagitis: Secondary | ICD-10-CM

## 2019-06-29 MED ORDER — ESTRADIOL 0.5 MG PO TABS: 0.2500 mg | ORAL_TABLET | ORAL | 0 refills | Status: DC

## 2019-06-29 MED ORDER — AMLODIPINE BESYLATE 5 MG PO TABS: 5.0000 mg | ORAL_TABLET | Freq: Every day | ORAL | 3 refills | Status: DC

## 2019-06-29 MED ORDER — TRAZODONE HCL 50 MG PO TABS: 25.0000 mg | ORAL_TABLET | Freq: Every evening | ORAL | 3 refills | Status: DC | PRN

## 2019-06-29 NOTE — Progress Notes (Signed)
6/11/20219:26 AM  Rachel [name] [birth year], 69 y.o., female [id number]  Chief Complaint  Patient presents with  . Hypertension  . Chest Pain    comes and goes    HPI:   Patient is a 69 y.o. female with past medical history significant for HTN, prediabetes, on HRT and gerd who presents today for routine followup  Last OV dec 2020 - increased PPI to BID  She reports that she started having intermittent left sided chest pain that started after doing heavy lifting at work Pain comes at random, pain is difficult for her to describe, but it is not heaviness, does not radiate, not associated with nausea, diaphoresis or SOB, lasts about 1-2 minutes  Having decreased appetite and not sleeping well Having problems with falling asleep She has had multiple deaths in the family and at work  Having long work days and weeks  Reflux is well controlled, has been able to decrease back to once a day  Depression screen [facility] 2/9 06/29/2019 04/26/2019 06/07/2018  Decreased Interest 0 0 1  Down, Depressed, Hopeless 0 0 1  PHQ - 2 Score 0 0 2  Altered sleeping - - 0  Tired, decreased energy - - 0  Change in appetite - - 0  Feeling bad or failure about yourself  - - 1  Trouble concentrating - - 0  Moving slowly or fidgety/restless - - 0  Suicidal thoughts - - 0  PHQ-9 Score - - 3    Fall Risk  06/29/2019 04/26/2019 01/05/2019 06/07/2018 03/08/2018  Falls in the past year? 0 0 0 0 0  Number falls in past yr: 0 0 0 - 0  Injury with Fall? 0 0 0 0 0  Follow up - Falls evaluation completed - - -     No Known Allergies  Prior to Admission medications   Medication Sig Start Date End Date Taking? Authorizing Provider  acyclovir (ZOVIRAX) 400 MG tablet Take 400 mg by mouth 2 (two) times daily.   Yes [provider]  amLODipine (NORVASC) 5 MG tablet Take 1 tablet (5 mg total) by mouth daily. 01/05/19  Yes Rutherford Guys, MD  calcium carbonate (CALCIUM 600) 600 MG TABS tablet Take 600  mg by mouth 2 (two) times daily with a meal.   Yes [provider]  Cyanocobalamin (B-12 PO) Take by mouth.   Yes [provider]  estradiol (ESTRACE) 0.5 MG tablet Take 0.25 mg by mouth daily. 04/23/19  Yes [provider]  Iron-Vitamins (GERITOL PO) Take by mouth daily.   Yes [provider]  Multiple Vitamin (MULTIVITAMIN WITH MINERALS) TABS tablet Take 1 tablet by mouth daily.   Yes [provider]  omeprazole (PRILOSEC) 20 MG capsule Take 1 capsule (20 mg total) by mouth 2 (two) times daily before a meal. 01/05/19  Yes Rutherford Guys, MD  PREMARIN 0.3 MG tablet Take 0.3 mg by mouth daily. 05/11/18  Yes [provider]    Past Medical History:  Diagnosis Date  . GERD (gastroesophageal reflux disease)   . Herpes   . Hypertension     Past Surgical History:  Procedure Laterality Date  . ABDOMINAL HYSTERECTOMY    . COLONOSCOPY      Social History   Tobacco Use  . Smoking status: Never Smoker  . Smokeless tobacco: Never Used  Substance Use Topics  . Alcohol use: No    Family History  Problem Relation Age of Onset  . Cancer Mother   .  Stroke Mother   . Heart disease Mother   . Heart disease Father   . Cancer Father        prostate cancer  . Colon cancer Maternal Grandmother        pt unsure, MGM might have had colon CA  . Esophageal cancer Neg Hx   . Stomach cancer Neg Hx   . Rectal cancer Neg Hx     Review of Systems  Constitutional: Negative for chills and fever.  Respiratory: Negative for cough and shortness of breath.   Cardiovascular: Positive for chest pain. Negative for palpitations and leg swelling.  Gastrointestinal: Negative for abdominal pain, nausea and vomiting.     OBJECTIVE:  Today's Vitals   06/29/19 0920  BP: 140/74  Pulse: 60  Temp: 98.1 F (36.7 C)  SpO2: 100%  Weight: 142 lb 6.4 oz (64.6 kg)  Height: 5' 2"  (1.575 m)   Body mass index is 26.05 kg/m.   Wt Readings from Last 3  Encounters:  06/29/19 142 lb 6.4 oz (64.6 kg)  04/26/19 141 lb 12.8 oz (64.3 kg)  01/05/19 144 lb 6.4 oz (65.5 kg)     Physical Exam Vitals and nursing note reviewed.  Constitutional:      Appearance: She is well-developed.  HENT:     Head: Normocephalic and atraumatic.     Mouth/Throat:     Pharynx: No oropharyngeal exudate.  Eyes:     General: No scleral icterus.    Conjunctiva/sclera: Conjunctivae normal.     Pupils: Pupils are equal, round, and reactive to light.  Cardiovascular:     Rate and Rhythm: Normal rate and regular rhythm.     Heart sounds: Normal heart sounds. No murmur heard.  No friction rub. No gallop.   Pulmonary:     Effort: Pulmonary effort is normal.     Breath sounds: Normal breath sounds. No wheezing, rhonchi or rales.  Chest:     Chest wall: Tenderness (mod-sev TTP left costchondral joint. ) present.  Musculoskeletal:     Cervical back: Neck supple.     Right lower leg: No edema.     Left lower leg: No edema.  Skin:    General: Skin is warm and dry.  Neurological:     General: No focal deficit present.     Mental Status: She is alert and oriented to person, place, and time.      My interpretation of EKG:  Sinus, HR 52, no ST changes  No results found for this or any previous visit (from the past 24 hour(s)).  No results found.   ASSESSMENT and PLAN  1. Costochondritis Discussed supportive measures. Patient educational handout given.  2. Chest pain, unspecified type Non-cardiac. ekg normal. RTC precautions reviewed.  - EKG 12-Lead  3. Psychophysiological insomnia Trial of trazodone, reviewed r/se/b  4. Essential hypertension Controlled. Continue current regime.  - TSH - Lipid panel - CMP14+EGFR  5. Prediabetes Checking labs today, medications will be adjusted as needed.  - Hemoglobin A1c  6. Gastroesophageal reflux disease without esophagitis Controlled. Continue current regime.   Other orders - estradiol (ESTRACE) 0.5 MG  tablet; Take 0.25 mg by mouth daily. - weaning off HRT  - traZODone (DESYREL) 50 MG tablet; Take 0.5-1 tablets (25-50 mg total) by mouth at bedtime as needed for sleep.  Return in about 6 months (around 12/29/2019).    Rutherford Guys, MD Primary Care at [place], [place] [address] Ph.  [phone] Fax [phone]

## 2019-06-29 NOTE — Patient Instructions (Addendum)
     If you have lab work done today you will be contacted with your lab results within the next 2 weeks.  If you have not heard from [place] then please contact us. The fastest way to get your results is to register for My Chart.   IF you received an x-ray today, you will receive an invoice from [facility]. Please contact [facility] at [phone] with questions or concerns regarding your invoice.   IF you received labwork today, you will receive an invoice from [facility]. Please contact [facility] at [phone] with questions or concerns regarding your invoice.   Our billing staff will not be able to assist you with questions regarding bills from these companies.  You will be contacted with the lab results as soon as they are available. The fastest way to get your results is to activate your My Chart account. Instructions are located on the last page of this paperwork. If you have not heard from [place] regarding the results in 2 weeks, please contact this office.     Costochondritis Costochondritis is swelling and irritation (inflammation) of the tissue (cartilage) that connects your ribs to your breastbone (sternum). This causes pain in the front of your chest. Usually, the pain:  Starts gradually.  Is in more than one rib. This condition usually goes away on its own over time. Follow these instructions at home:  Do not do anything that makes your pain worse.  If directed, put ice on the painful area: ? Put ice in a plastic bag. ? Place a towel between your skin and the bag. ? Leave the ice on for 20 minutes, 2-3 times a day.  If directed, put heat on the affected area as often as told by your doctor. Use the heat source that your doctor tells you to use, such as a moist heat pack or a heating pad. ? Place a towel between your skin and the heat source. ? Leave the heat on for 20-30 minutes. ? Take off the heat if your skin turns bright red. This is very important if  you cannot feel pain, heat, or cold. You may have a greater risk of getting burned.  Take over-the-counter and prescription medicines only as told by your doctor.  Return to your normal activities as told by your doctor. Ask your doctor what activities are safe for you.  Keep all follow-up visits as told by your doctor. This is important. Contact a doctor if:  You have chills or a fever.  Your pain does not go away or it gets worse.  You have a cough that does not go away. Get help right away if:  You are short of breath. This information is not intended to replace advice given to you by your health care provider. Make sure you discuss any questions you have with your health care provider. Document Revised: 01/19/2017 Document Reviewed: 04/30/2015 Elsevier Patient Education  2020 [employer].

## 2019-06-30 LAB — CMP14+EGFR
ALT: 11 IU/L (ref 0–32)
AST: 20 IU/L (ref 0–40)
Albumin/Globulin Ratio: 1.8 (ref 1.2–2.2)
Albumin: 4.5 g/dL (ref 3.8–4.8)
Alkaline Phosphatase: 78 IU/L (ref 48–121)
BUN/Creatinine Ratio: 34 — ABNORMAL HIGH (ref 12–28)
BUN: 20 mg/dL (ref 8–27)
Bilirubin Total: 0.5 mg/dL (ref 0.0–1.2)
CO2: 22 mmol/L (ref 20–29)
Calcium: 9.6 mg/dL (ref 8.7–10.3)
Chloride: 103 mmol/L (ref 96–106)
Creatinine, Ser: 0.59 mg/dL (ref 0.57–1.00)
GFR calc Af Amer: 108 mL/min/{1.73_m2} (ref >59)
GFR calc non Af Amer: 94 mL/min/{1.73_m2} (ref >59)
Globulin, Total: 2.5 g/dL (ref 1.5–4.5)
Glucose: 94 mg/dL (ref 65–99)
Potassium: 4.2 mmol/L (ref 3.5–5.2)
Sodium: 138 mmol/L (ref 134–144)
Total Protein: 7 g/dL (ref 6.0–8.5)

## 2019-06-30 LAB — LIPID PANEL
Chol/HDL Ratio: 1.8 ratio (ref 0.0–4.4)
Cholesterol, Total: 175 mg/dL (ref 100–199)
HDL: 96 mg/dL (ref >39)
LDL Chol Calc (NIH): 72 mg/dL (ref 0–99)
Triglycerides: 29 mg/dL (ref 0–149)
VLDL Cholesterol Cal: 7 mg/dL (ref 5–40)

## 2019-06-30 LAB — HEMOGLOBIN A1C
Est. average glucose Bld gHb Est-mCnc: 123 mg/dL
Hgb A1c MFr Bld: 5.9 % — ABNORMAL HIGH (ref 4.8–5.6)

## 2019-06-30 LAB — TSH: TSH: 1.45 u[IU]/mL (ref 0.450–4.500)

## 2019-07-06 NOTE — Progress Notes (Signed)
Lab Letter SENT  

## 2019-07-22 ENCOUNTER — Other Ambulatory Visit: Payer: Self-pay | Admitting: Family Medicine

## 2019-07-22 NOTE — Telephone Encounter (Signed)
Requested Prescriptions  Pending Prescriptions Disp Refills   traZODone (DESYREL) 50 MG tablet [Pharmacy Med Name: TRAZODONE 50 MG TABLET] 90 tablet 0    Sig: TAKE 0.5-1 TABLETS (25-50 MG TOTAL) BY MOUTH AT BEDTIME AS NEEDED FOR SLEEP.     Psychiatry: Antidepressants - Serotonin Modulator Passed - 07/22/2019 12:30 PM      Passed - Valid encounter within last 6 months    Recent Outpatient Visits          3 weeks ago Costochondritis   [facility], Lilia Argue, MD   2 months ago Seasonal allergic rhinitis due to pollen   [facility] at [facility], Lorelee Market, NP   6 months ago Essential hypertension   [facility], Lilia Argue, MD   1 year ago Essential hypertension   [facility], Lilia Argue, MD   1 year ago Essential hypertension   [facility], Lilia Argue, MD      Future Appointments            In 5 months Rutherford Guys, MD Primary Care at [place], [facility]

## 2019-07-27 ENCOUNTER — Ambulatory Visit: Payer: 59 | Admitting: Family Medicine

## 2019-09-07 ENCOUNTER — Encounter: Payer: Self-pay | Admitting: Family Medicine

## 2019-09-07 ENCOUNTER — Other Ambulatory Visit: Payer: Self-pay

## 2019-09-07 ENCOUNTER — Ambulatory Visit: Payer: 59 | Admitting: Family Medicine

## 2019-09-07 VITALS — BP 131/71 | HR 60 | Temp 98.2°F | Ht 62.0 in | Wt 143.6 lb

## 2019-09-07 DIAGNOSIS — M25511 Pain in right shoulder: Secondary | ICD-10-CM

## 2019-09-07 MED ORDER — PREDNISONE 10 MG PO TABS
ORAL_TABLET | ORAL | 0 refills | Status: AC
Start: 2019-09-07 — End: 2019-09-11

## 2019-09-07 NOTE — Progress Notes (Signed)
8/20/202110:40 AM  Rachel [name] 07-11-1950, 69 y.o., female [id number]  Chief Complaint  Patient presents with  . Pain    pt stand and lifts at work. hurt right shoulder and hand. Does not take pain pils uses topical rubs, work a little bit. FYI No longer tkaing the tramadol, gives her insomnia. Asking for letter to be off work today and tomorrow. She is on vacation nx wk to rest arm    HPI:   Patient is a 69 y.o. female with past medical history significant for HTN, prediabetes, on HRT and gerd  who presents today for right shoulder/hand pain  Patient reports with 2 weeks of right shoulder pain, all around, feels heavy, swollen, radiates down to her right thumb at times, no numbness or tingling, no actual weakness, no midline neck pain, occ associated with right sided headache   Depression screen [facility] 2/9 06/29/2019 04/26/2019 06/07/2018  Decreased Interest 0 0 1  Down, Depressed, Hopeless 0 0 1  PHQ - 2 Score 0 0 2  Altered sleeping - - 0  Tired, decreased energy - - 0  Change in appetite - - 0  Feeling bad or failure about yourself  - - 1  Trouble concentrating - - 0  Moving slowly or fidgety/restless - - 0  Suicidal thoughts - - 0  PHQ-9 Score - - 3    Fall Risk  09/07/2019 06/29/2019 04/26/2019 01/05/2019 06/07/2018  Falls in the past year? 0 0 0 0 0  Number falls in past yr: 0 0 0 0 -  Injury with Fall? 0 0 0 0 0  Follow up - - Falls evaluation completed - -     No Known Allergies  Prior to Admission medications   Medication Sig Start Date End Date Taking? Authorizing Provider  acyclovir (ZOVIRAX) 400 MG tablet Take 400 mg by mouth 2 (two) times daily.   Yes [provider]  amLODipine (NORVASC) 5 MG tablet Take 1 tablet (5 mg total) by mouth daily. 06/29/19  Yes Rutherford Guys, MD  calcium carbonate (CALCIUM 600) 600 MG TABS tablet Take 600 mg by mouth 2 (two) times daily with a meal.   Yes [provider]  Cyanocobalamin (B-12 PO) Take by  mouth.   Yes [provider]  estradiol (ESTRACE) 0.5 MG tablet Take 0.5 tablets (0.25 mg total) by mouth every other day. 06/29/19  Yes Rutherford Guys, MD  Iron-Vitamins (GERITOL PO) Take by mouth daily.   Yes [provider]  Multiple Vitamin (MULTIVITAMIN WITH MINERALS) TABS tablet Take 1 tablet by mouth daily.   Yes [provider]  omeprazole (PRILOSEC) 20 MG capsule Take 1 capsule (20 mg total) by mouth 2 (two) times daily before a meal. 01/05/19  Yes Rutherford Guys, MD  PREMARIN 0.3 MG tablet Take 0.3 mg by mouth daily. 05/11/18  Yes [provider]    Past Medical History:  Diagnosis Date  . GERD (gastroesophageal reflux disease)   . Herpes   . Hypertension     Past Surgical History:  Procedure Laterality Date  . ABDOMINAL HYSTERECTOMY    . COLONOSCOPY      Social History   Tobacco Use  . Smoking status: Never Smoker  . Smokeless tobacco: Never Used  Substance Use Topics  . Alcohol use: No    Family History  Problem Relation Age of Onset  . Cancer Mother   . Stroke Mother   . Heart disease Mother   .  Heart disease Father   . Cancer Father        prostate cancer  . Colon cancer Maternal Grandmother        pt unsure, MGM might have had colon CA  . Esophageal cancer Neg Hx   . Stomach cancer Neg Hx   . Rectal cancer Neg Hx     ROS Per hpi  OBJECTIVE:  Today's Vitals   09/07/19 1026  BP: 131/71  Pulse: 60  Temp: 98.2 F (36.8 C)  SpO2: 100%  Weight: 143 lb 9.6 oz (65.1 kg)  Height: 5\' 2"  (1.575 m)   Body mass index is 26.26 kg/m.   Physical Exam Vitals and nursing note reviewed.  Constitutional:      Appearance: She is well-developed.  HENT:     Head: Normocephalic and atraumatic.  Eyes:     General: No scleral icterus.    Conjunctiva/sclera: Conjunctivae normal.     Pupils: Pupils are equal, round, and reactive to light.  Pulmonary:     Effort: Pulmonary effort is normal.  Musculoskeletal:      Right shoulder: Tenderness present. No swelling or bony tenderness. Decreased range of motion (pain and decreased ROM in external rotation, abduction, interanal rotation). Decreased strength (difficult to assess due to pain). Normal pulse.     Cervical back: Neck supple. No swelling, spasms or tenderness. Normal range of motion.     Thoracic back: Tenderness (midline ~ T1) present. No swelling or spasms.  Skin:    General: Skin is warm and dry.  Neurological:     Mental Status: She is alert and oriented to person, place, and time.     No results found for this or any previous visit (from the past 24 hour(s)).  No results found.   ASSESSMENT and PLAN 1. Acute pain of right shoulder Exam concerning for frozen shoulder, rx for pred taper given. Discussed pendulum swing ROM exercise. Referring to sports medicine for further eval and treatment. Work excuse given. - Ambulatory referral to Sports Medicine  Other orders - predniSONE (DELTASONE) 10 MG tablet; Take 4 tablets (40 mg total) by mouth daily with breakfast for 1 day, THEN 3 tablets (30 mg total) daily with breakfast for 1 day, THEN 2 tablets (20 mg total) daily with breakfast for 1 day, THEN 1 tablet (10 mg total) daily with breakfast for 1 day.  No follow-ups on file.    Rutherford Guys, MD Primary Care at [place], [place] [address] Ph.  [phone] Fax [phone]

## 2019-09-07 NOTE — Patient Instructions (Signed)
° ° ° °  If you have lab work done today you will be contacted with your lab results within the next 2 weeks.  If you have not heard from us then please contact us. The fastest way to get your results is to register for My Chart. ° ° °IF you received an x-ray today, you will receive an invoice from [facility]. Please contact [facility] at [phone] with questions or concerns regarding your invoice.  ° °IF you received labwork today, you will receive an invoice from [facility]. Please contact [facility] at [phone] with questions or concerns regarding your invoice.  ° °Our billing staff will not be able to assist you with questions regarding bills from these companies. ° °You will be contacted with the lab results as soon as they are available. The fastest way to get your results is to activate your My Chart account. Instructions are located on the last page of this paperwork. If you have not heard from us regarding the results in 2 weeks, please contact this office. °  ° ° ° °

## 2019-09-12 ENCOUNTER — Telehealth: Payer: Self-pay | Admitting: Family Medicine

## 2019-09-12 NOTE — Telephone Encounter (Signed)
I have called pt back and informed her that the prednisone was only for a short time and it is not a long term medication. Pt stated that her medications has worked better since her shoulder did not hurt so she canceled the appointment to see someone abut it. She stated that if it starts to hurt again then she will call and get another appointment.

## 2019-09-12 NOTE — Telephone Encounter (Signed)
Patient states she has 2 more pills left of predniSONE (DELTASONE) 10 MG tablet and states medication is working for shoulder pain. Patient would like a refill, informed please allow 48 to 72 hour turn around time   [id number] - [place], [place] [address] Phone:  [phone]  Fax:  [phone]

## 2019-09-14 ENCOUNTER — Ambulatory Visit: Payer: Managed Care, Other (non HMO) | Admitting: Family Medicine

## 2019-09-30 ENCOUNTER — Other Ambulatory Visit: Payer: Self-pay | Admitting: Family Medicine

## 2019-10-30 ENCOUNTER — Ambulatory Visit (HOSPITAL_COMMUNITY)
Admission: EM | Admit: 2019-10-30 | Discharge: 2019-10-30 | Disposition: A | Discharge status: Home / Self Care | Payer: 59 | Attending: Family Medicine | Admitting: Family Medicine

## 2019-10-30 ENCOUNTER — Encounter (HOSPITAL_COMMUNITY): Payer: Self-pay

## 2019-10-30 DIAGNOSIS — R5383 Other fatigue: Secondary | ICD-10-CM | POA: Diagnosis present

## 2019-10-30 DIAGNOSIS — Z20822 Contact with and (suspected) exposure to covid-19: Secondary | ICD-10-CM

## 2019-10-30 NOTE — ED Triage Notes (Addendum)
Patient in with c/o of fatigue and not feeling like herself today. 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

## 2019-10-30 NOTE — ED Provider Notes (Signed)
[facility]    CSN: [id number] Arrival date & time: 10/30/19  1514      History   Chief Complaint Chief Complaint  Patient presents with  . Fatigue    HPI Rachel [name] is a 69 y.o. female history of HTN, presenting today for evaluation of fatigue. Reports this morning woke up and has been very tired and fatigued today. Slept later than normal. Reports possible exposures at work to [employer]. Reports decreased appetite.   HPI  Past Medical History:  Diagnosis Date  . GERD (gastroesophageal reflux disease)   . Herpes   . Hypertension     Patient Active Problem List   Diagnosis Date Noted  . Seasonal allergic rhinitis due to pollen 05/30/2019  . Genital herpes simplex 01/05/2019  . Hormone replacement therapy (postmenopausal) 09/07/2016  . Gastroesophageal reflux disease without esophagitis 02/17/2015  . Essential hypertension 11/30/2012    Past Surgical History:  Procedure Laterality Date  . ABDOMINAL HYSTERECTOMY    . COLONOSCOPY      OB History   No obstetric history on file.      Home Medications    Prior to Admission medications   Medication Sig Start Date End Date Taking? Authorizing Provider  acyclovir (ZOVIRAX) 400 MG tablet Take 400 mg by mouth 2 (two) times daily.   Yes [provider]  amLODipine (NORVASC) 5 MG tablet Take 1 tablet (5 mg total) by mouth daily. 06/29/19  Yes Rutherford Guys, MD  calcium carbonate (CALCIUM 600) 600 MG TABS tablet Take 600 mg by mouth 2 (two) times daily with a meal.   Yes [provider]  Cyanocobalamin (B-12 PO) Take by mouth.   Yes [provider]  estradiol (ESTRACE) 0.5 MG tablet TAKE 0.5 TABLETS (0.25 MG TOTAL) BY MOUTH EVERY OTHER DAY. 09/30/19  Yes Rutherford Guys, MD  Multiple Vitamin (MULTIVITAMIN WITH MINERALS) TABS tablet Take 1 tablet by mouth daily.   Yes [provider]  omeprazole (PRILOSEC) 20 MG capsule Take 1 capsule (20 mg total) by mouth 2 (two)  times daily before a meal. 01/05/19  Yes Rutherford Guys, MD  Iron-Vitamins (GERITOL PO) Take by mouth daily.    [provider]  PREMARIN 0.3 MG tablet Take 0.3 mg by mouth daily. 05/11/18   [provider]    Family History Family History  Problem Relation Age of Onset  . Cancer Mother   . Stroke Mother   . Heart disease Mother   . Heart disease Father   . Cancer Father        prostate cancer  . Colon cancer Maternal Grandmother        pt unsure, MGM might have had colon CA  . Esophageal cancer Neg Hx   . Stomach cancer Neg Hx   . Rectal cancer Neg Hx     Social History Social History   Tobacco Use  . Smoking status: Never Smoker  . Smokeless tobacco: Never Used  Substance Use Topics  . Alcohol use: No  . Drug use: No     Allergies   Patient has no known allergies.   Review of Systems Review of Systems  Constitutional: Positive for appetite change and fatigue. Negative for activity change, chills and fever.  HENT: Positive for sore throat. Negative for congestion, ear pain, rhinorrhea, sinus pressure and trouble swallowing.   Eyes: Negative for discharge and redness.  Respiratory: Negative for cough, chest tightness and shortness of breath.   Cardiovascular:  Negative for chest pain.  Gastrointestinal: Negative for abdominal pain, diarrhea, nausea and vomiting.  Musculoskeletal: Negative for myalgias.  Skin: Negative for rash.  Neurological: Negative for dizziness, light-headedness and headaches.     Physical Exam Triage Vital Signs ED Triage Vitals  Enc Vitals Group     BP 10/30/19 1649 (!) 122/59     Pulse Rate 10/30/19 1649 65     Resp 10/30/19 1649 18     Temp 10/30/19 1649 98.3 F (36.8 C)     Temp Source 10/30/19 1649 Oral     SpO2 10/30/19 1649 99 %     Weight --      Height --      Head Circumference --      Peak Flow --      Pain Score 10/30/19 1644 0     Pain Loc --      Pain Edu? --      Excl. in [place]? --    No data  found.  Updated Vital Signs BP (!) 122/59 (BP Location: Right Arm)   Pulse 65   Temp 98.3 F (36.8 C) (Oral)   Resp 18   LMP 08/01/2012   SpO2 99%   Visual Acuity Right Eye Distance:   Left Eye Distance:   Bilateral Distance:    Right Eye Near:   Left Eye Near:    Bilateral Near:     Physical Exam Vitals and nursing note reviewed.  Constitutional:      Appearance: She is well-developed.     Comments: No acute distress  HENT:     Head: Normocephalic and atraumatic.     Ears:     Comments: Bilateral ears without tenderness to palpation of external auricle, tragus and mastoid, EAC's without erythema or swelling, TM's with good bony landmarks and cone of light. Non erythematous.     Nose: Nose normal.     Mouth/Throat:     Comments: Oral mucosa pink and moist, no tonsillar enlargement or exudate. Posterior pharynx patent and nonerythematous, no uvula deviation or swelling. Normal phonation. Eyes:     Conjunctiva/sclera: Conjunctivae normal.  Cardiovascular:     Rate and Rhythm: Normal rate.  Pulmonary:     Effort: Pulmonary effort is normal. No respiratory distress.     Comments: Breathing comfortably at rest, CTABL, no wheezing, rales or other adventitious sounds auscultated Abdominal:     General: There is no distension.  Musculoskeletal:        General: Normal range of motion.     Cervical back: Neck supple.  Skin:    General: Skin is warm and dry.  Neurological:     Mental Status: She is alert and oriented to person, place, and time.      UC Treatments / Results  Labs (all labs ordered are listed, but only abnormal results are displayed) Labs Reviewed  SARS CORONAVIRUS 2 (TAT 6-24 HRS)    EKG   Radiology No results found.  Procedures Procedures (including critical care time)  Medications Ordered in UC Medications - No data to display  Initial Impression / Assessment and Plan / UC Course  I have reviewed the triage vital signs and the nursing  notes.  Pertinent labs & imaging results that were available during my care of the patient were reviewed by me and considered in my medical decision making (see chart for details).     1 day of fatigue-vital signs stable, exam reassuring.  Covid test pending.  Rest and  fluids and continue to monitor for resolution of symptoms or development of other symptoms over the next few days.  Discussed strict return precautions. Patient verbalized understanding and is agreeable with plan.  Final Clinical Impressions(s) / UC Diagnoses   Final diagnoses:  Fatigue, unspecified type  Encounter for laboratory testing for COVID-19 virus     Discharge Instructions     Rest and fluids Check mychart for COVID  results    ED Prescriptions    None     PDMP not reviewed this encounter.   Janith Lima, [place] 10/30/19 1733

## 2019-10-30 NOTE — Discharge Instructions (Signed)
Rest and fluids Check mychart for COVID  results

## 2019-10-31 ENCOUNTER — Ambulatory Visit (HOSPITAL_COMMUNITY)
Admission: EM | Admit: 2019-10-31 | Discharge: 2019-10-31 | Disposition: A | Discharge status: Home / Self Care | Payer: Managed Care, Other (non HMO)

## 2019-10-31 LAB — SARS CORONAVIRUS 2 (TAT 6-24 HRS): SARS Coronavirus 2: NEGATIVE

## 2019-11-26 ENCOUNTER — Telehealth: Payer: Self-pay | Admitting: *Deleted

## 2019-11-26 NOTE — Telephone Encounter (Signed)
Schedule mobile mammogram

## 2019-11-27 ENCOUNTER — Telehealth: Payer: Self-pay | Admitting: *Deleted

## 2019-11-27 NOTE — Telephone Encounter (Signed)
Patient decline mammogram

## 2019-12-28 ENCOUNTER — Ambulatory Visit: Payer: 59 | Admitting: Family Medicine

## 2020-01-07 ENCOUNTER — Telehealth: Payer: Self-pay | Admitting: Family Medicine

## 2020-01-07 NOTE — Telephone Encounter (Signed)
Patient needs refills for  estradiol (ESTRACE) 0.5 MG tablet [[id number]]   acyclovir (ZOVIRAX) 400 MG tablet [[id number]]   Patient took last dose of estradiol 01/06/2020   Pharmacy:  [id number] [place], [place]  [address] [place], [place] [address]  Phone:  [phone] Fax:  [phone]  DEA #:  [id number]  Please advise at [phone]

## 2020-01-08 ENCOUNTER — Other Ambulatory Visit: Payer: Self-pay

## 2020-01-08 MED ORDER — ACYCLOVIR 400 MG PO TABS: 400.0000 mg | ORAL_TABLET | Freq: Two times a day (BID) | ORAL | 0 refills | Status: DC

## 2020-01-08 MED ORDER — ESTRADIOL 0.5 MG PO TABS: 0.2500 mg | ORAL_TABLET | ORAL | 0 refills | Status: DC

## 2020-01-08 NOTE — Telephone Encounter (Signed)
I have sent in refills on both for her but she needs a TOC

## 2020-01-08 NOTE — Telephone Encounter (Signed)
Pt has a TOC on 04/18/19 with Dr. Carlota Raspberry

## 2020-01-26 ENCOUNTER — Other Ambulatory Visit: Payer: Self-pay | Admitting: Registered Nurse

## 2020-01-26 NOTE — Telephone Encounter (Signed)
Requested medications are due for refill today yes  Requested medications are on the active medication list yes  Last refill 12/21  Notes to clinic Unclear if was to be continued, only 45 ordered at twice daily.

## 2020-02-17 ENCOUNTER — Other Ambulatory Visit: Payer: Self-pay | Admitting: Registered Nurse

## 2020-02-17 NOTE — Telephone Encounter (Signed)
Requested Prescriptions  Pending Prescriptions Disp Refills  . estradiol (ESTRACE) 0.5 MG tablet [Pharmacy Med Name: ESTRADIOL 0.5 MG TABLET] 45 tablet     Sig: TAKE 0.5 TABLETS (0.25 MG TOTAL) BY MOUTH EVERY OTHER DAY.     OB/GYN:  Estrogens Passed - 02/17/2020  5:47 PM      Passed - Mammogram is up-to-date per Health Maintenance      Passed - Last BP in normal range    BP Readings from Last 1 Encounters:  10/30/19 (!) 122/59         Passed - Valid encounter within last 12 months    Recent Outpatient Visits          5 months ago Acute pain of right shoulder   Primary Care at [facility], Lilia Argue, MD   7 months ago Costochondritis   Primary Care at [facility], Lilia Argue, MD   9 months ago Seasonal allergic rhinitis due to pollen   Primary Care at [facility], Lorelee Market, NP   1 year ago Essential hypertension   Primary Care at [facility], Lilia Argue, MD   1 year ago Essential hypertension   Primary Care at [facility], Lilia Argue, MD      Future Appointments            In 2 months Wendie Agreste, MD Primary Care at [place], [facility]           . acyclovir (ZOVIRAX) 400 MG tablet [Pharmacy Med Name: ACYCLOVIR 400 MG TABLET] 45 tablet 0    Sig: TAKE 1 TABLET BY MOUTH TWICE A DAY     Antimicrobials:  Antiviral Agents - Anti-Herpetic Passed - 02/17/2020  5:47 PM      Passed - Valid encounter within last 12 months    Recent Outpatient Visits          5 months ago Acute pain of right shoulder   Primary Care at [facility], Lilia Argue, MD   7 months ago Costochondritis   Primary Care at [facility], Lilia Argue, MD   9 months ago Seasonal allergic rhinitis due to pollen   Primary Care at [facility], Lorelee Market, NP   1 year ago Essential hypertension   Primary Care at [facility], Lilia Argue, MD   1 year ago Essential hypertension   Primary Care at [facility], Lilia Argue, MD      Future Appointments            In 2  months Carlota Raspberry Ranell Patrick, MD Primary Care at [place], [facility]

## 2020-02-20 MED ORDER — OMEPRAZOLE 20 MG PO CPDR: 20.0000 mg | DELAYED_RELEASE_CAPSULE | Freq: Two times a day (BID) | ORAL | 0 refills | Status: DC

## 2020-02-20 NOTE — Telephone Encounter (Signed)
Patient has appointment 04/17/20- RF approved through appointment

## 2020-02-20 NOTE — Addendum Note (Signed)
Addended by: Valli Glance F on: 02/20/2020 11:13 AM   Modules accepted: Orders

## 2020-02-20 NOTE — Telephone Encounter (Signed)
omeprazole (PRILOSEC) 20 MG capsule  Pt is completely out and needs refill   [id number] [place], [place]  [address] [place] [address]  Phone: [phone] Fax: [phone]

## 2020-03-05 ENCOUNTER — Other Ambulatory Visit: Payer: Self-pay | Admitting: Family Medicine

## 2020-03-05 NOTE — Telephone Encounter (Signed)
Requested Prescriptions  Pending Prescriptions Disp Refills  . acyclovir (ZOVIRAX) 400 MG tablet [Pharmacy Med Name: ACYCLOVIR 400 MG TABLET] 45 tablet 0    Sig: TAKE 1 TABLET BY MOUTH TWICE A DAY     Antimicrobials:  Antiviral Agents - Anti-Herpetic Passed - 03/05/2020 12:08 AM      Passed - Valid encounter within last 12 months    Recent Outpatient Visits          6 months ago Acute pain of right shoulder   Primary Care at [facility], Lilia Argue, MD   8 months ago Costochondritis   Primary Care at [facility], Lilia Argue, MD   10 months ago Seasonal allergic rhinitis due to pollen   Primary Care at [facility], Lorelee Market, NP   1 year ago Essential hypertension   Primary Care at [facility], Lilia Argue, MD   1 year ago Essential hypertension   Primary Care at [facility], Lilia Argue, MD      Future Appointments            In 1 month Carlota Raspberry Ranell Patrick, MD Primary Care at [place], [facility]

## 2020-03-31 ENCOUNTER — Other Ambulatory Visit: Payer: Self-pay | Admitting: Registered Nurse

## 2020-03-31 ENCOUNTER — Other Ambulatory Visit: Payer: Self-pay | Admitting: Family Medicine

## 2020-03-31 NOTE — Telephone Encounter (Signed)
Requested medication (s) are due for refill today:   Yes  Requested medication (s) are on the active medication list:   Yes  Future visit scheduled:   No  Last seen by Dr. Pamella Pert 6 mo. ago   Last ordered: 01/08/2020 #45, 0 refills  Clinic note:  Returned because former Dr. Pamella Pert pt.   Not been seen by another provider.   Requested Prescriptions  Pending Prescriptions Disp Refills   estradiol (ESTRACE) 0.5 MG tablet [Pharmacy Med Name: ESTRADIOL 0.5 MG TABLET] 45 tablet 0    Sig: Take 0.5 tablets (0.25 mg total) by mouth every other day.      OB/GYN:  Estrogens Passed - 03/31/2020  1:14 PM      Passed - Mammogram is up-to-date per Health Maintenance      Passed - Last BP in normal range    BP Readings from Last 1 Encounters:  10/30/19 (!) 122/59          Passed - Valid encounter within last 12 months    Recent Outpatient Visits           6 months ago Acute pain of right shoulder   Primary Care at [facility], Lilia Argue, MD   9 months ago Costochondritis   Primary Care at [facility], Lilia Argue, MD   11 months ago Seasonal allergic rhinitis due to pollen   Primary Care at [facility], Lorelee Market, NP   1 year ago Essential hypertension   Primary Care at [facility], Lilia Argue, MD   1 year ago Essential hypertension   Primary Care at [facility], Lilia Argue, MD

## 2020-04-07 ENCOUNTER — Encounter: Payer: Self-pay | Admitting: Family Medicine

## 2020-04-07 ENCOUNTER — Other Ambulatory Visit: Payer: Self-pay

## 2020-04-07 ENCOUNTER — Ambulatory Visit: Payer: 59 | Admitting: Family Medicine

## 2020-04-07 ENCOUNTER — Telehealth: Payer: Self-pay

## 2020-04-07 VITALS — BP 146/74 | HR 56 | Temp 97.6°F | Ht 62.0 in | Wt 146.0 lb

## 2020-04-07 DIAGNOSIS — A6000 Herpesviral infection of urogenital system, unspecified: Secondary | ICD-10-CM

## 2020-04-07 DIAGNOSIS — I10 Essential (primary) hypertension: Secondary | ICD-10-CM | POA: Diagnosis not present

## 2020-04-07 DIAGNOSIS — R7303 Prediabetes: Secondary | ICD-10-CM | POA: Diagnosis not present

## 2020-04-07 DIAGNOSIS — Z79890 Hormone replacement therapy (postmenopausal): Secondary | ICD-10-CM

## 2020-04-07 DIAGNOSIS — Z1211 Encounter for screening for malignant neoplasm of colon: Secondary | ICD-10-CM | POA: Diagnosis not present

## 2020-04-07 MED ORDER — AMLODIPINE BESYLATE 10 MG PO TABS: 10.0000 mg | ORAL_TABLET | Freq: Every day | ORAL | 3 refills | Status: DC

## 2020-04-07 MED ORDER — ACYCLOVIR 400 MG PO TABS: 400.0000 mg | ORAL_TABLET | Freq: Two times a day (BID) | ORAL | 0 refills | Status: DC

## 2020-04-07 MED ORDER — ESTRADIOL 0.5 MG PO TABS: 0.2500 mg | ORAL_TABLET | ORAL | 0 refills | Status: DC

## 2020-04-07 NOTE — Patient Instructions (Addendum)
  Health Maintenance After Age 71 After age 71, you are at a higher risk for certain long-term diseases and infections as well as injuries from falls. Falls are a major cause of broken bones and head injuries in people who are older than age 71. Getting regular preventive care can help to keep you healthy and well. Preventive care includes getting regular testing and making lifestyle changes as recommended by your health care provider. Talk with your health care provider about:  Which screenings and tests you should have. A screening is a test that checks for a disease when you have no symptoms.  A diet and exercise plan that is right for you. What should I know about screenings and tests to prevent falls? Screening and testing are the best ways to find a health problem early. Early diagnosis and treatment give you the best chance of managing medical conditions that are common after age 71. Certain conditions and lifestyle choices may make you more likely to have a fall. Your health care provider may recommend:  Regular vision checks. Poor vision and conditions such as cataracts can make you more likely to have a fall. If you wear glasses, make sure to get your prescription updated if your vision changes.  Medicine review. Work with your health care provider to regularly review all of the medicines you are taking, including over-the-counter medicines. Ask your health care provider about any side effects that may make you more likely to have a fall. Tell your health care provider if any medicines that you take make you feel dizzy or sleepy.  Osteoporosis screening. Osteoporosis is a condition that causes the bones to get weaker. This can make the bones weak and cause them to break more easily.  Blood pressure screening. Blood pressure changes and medicines to control blood pressure can make you feel dizzy.  Strength and balance checks. Your health care provider may recommend certain tests to  check your strength and balance while standing, walking, or changing positions.  Foot health exam. Foot pain and numbness, as well as not wearing proper footwear, can make you more likely to have a fall.  Depression screening. You may be more likely to have a fall if you have a fear of falling, feel emotionally low, or feel unable to do activities that you used to do.  Alcohol use screening. Using too much alcohol can affect your balance and may make you more likely to have a fall. What actions can I take to lower my risk of falls? General instructions  Talk with your health care provider about your risks for falling. Tell your health care provider if: ? You fall. Be sure to tell your health care provider about all falls, even ones that seem minor. ? You feel dizzy, sleepy, or off-balance.  Take over-the-counter and prescription medicines only as told by your health care provider. These include any supplements.  Eat a healthy diet and maintain a healthy weight. A healthy diet includes low-fat dairy products, low-fat (lean) meats, and fiber from whole grains, beans, and lots of fruits and vegetables. Home safety  Remove any tripping hazards, such as rugs, cords, and clutter.  Install safety equipment such as grab bars in bathrooms and safety rails on stairs.  Keep rooms and walkways well-lit. Activity  Follow a regular exercise program to stay fit. This will help you maintain your balance. Ask your health care provider what types of exercise are appropriate for you.  If you need a cane   or walker, use it as recommended by your health care provider.  Wear supportive shoes that have nonskid soles.   Lifestyle  Do not drink alcohol if your health care provider tells you not to drink.  If you drink alcohol, limit how much you have: ? 0-1 drink a day for women. ? 0-2 drinks a day for men.  Be aware of how much alcohol is in your drink. In the [place] one drink equals one typical bottle  of beer (12 oz), one-half glass of wine (5 oz), or one shot of hard liquor (1 oz).  Do not use any products that contain nicotine or tobacco, such as cigarettes and e-cigarettes. If you need help quitting, ask your health care provider. Summary  Having a healthy lifestyle and getting preventive care can help to protect your health and wellness after age 71.  Screening and testing are the best way to find a health problem early and help you avoid having a fall. Early diagnosis and treatment give you the best chance for managing medical conditions that are more common for people who are older than age 71.  Falls are a major cause of broken bones and head injuries in people who are older than age 71. Take precautions to prevent a fall at home.  Work with your health care provider to learn what changes you can make to improve your health and wellness and to prevent falls. This information is not intended to replace advice given to you by your health care provider. Make sure you discuss any questions you have with your health care provider. Document Revised: 04/27/2018 Document Reviewed: 11/17/2016 Elsevier Patient Education  2021 Elsevier Inc.   If you have lab work done today you will be contacted with your lab results within the next 2 weeks.  If you have not heard from us then please contact us. The fastest way to get your results is to register for My Chart.   IF you received an x-ray today, you will receive an invoice from [facility]. Please contact [facility] at [phone] with questions or concerns regarding your invoice.   IF you received labwork today, you will receive an invoice from [facility]. Please contact [facility] at [phone] with questions or concerns regarding your invoice.   Our billing staff will not be able to assist you with questions regarding bills from these companies.  You will be contacted with the lab results as soon as they are available.  The fastest way to get your results is to activate your My Chart account. Instructions are located on the last page of this paperwork. If you have not heard from us regarding the results in 2 weeks, please contact this office.      

## 2020-04-07 NOTE — Telephone Encounter (Signed)
Work note is ready for pick up. Patient was informed. (patient to return to work on 04/08/20)

## 2020-04-07 NOTE — Progress Notes (Signed)
3/21/20228:57 AM  Rachel [name] 1950/12/28, 70 y.o., female [id number]  Chief Complaint  Patient presents with  . Hypertension    Follow up   . Right shoulder pain    HPI:   Patient is a 70 y.o. female with past medical history significant for HTN, prediabetes, on HRT and gerd who presents today for medication refills.  HTN Amlodipine 5 mg Doesn't check BP at home Took medications this morning Goal< 130/80 BP Readings from Last 3 Encounters:  04/07/20 (!) 146/74  10/30/19 (!) 122/59  09/07/19 131/71   GERD Tums Omeprazole Well controlled when takes medications  Pre-diabetes Lab Results  Component Value Date   HGBA1C 5.9 (H) 06/29/2019   Has occasional right shoulder pain This has been getting worse over the past 3 weeks Still works full time in [place] She gets bored when she doesn't work Doesn't take anything from the pain   Health Maintenance  Topic Date Due  . COLONOSCOPY (Pts 45-52yr Insurance coverage will need to be confirmed)  01/26/2019  . COVID-19 Vaccine (3 - Booster for Pfizer series) 01/03/2020  . INFLUENZA VACCINE  05/24/2020 (Originally 08/19/2019)  . TETANUS/TDAP  06/28/2020 (Originally [birth year]/1969)  . PNA vac Low Risk Adult (2 of 2 - PPSV23) 06/28/2020 (Originally 03/09/2019)  . MAMMOGRAM  12/10/2020  . DEXA SCAN  Completed  . Hepatitis C Screening  Completed  . HPV VACCINES  Aged Out     [facility] 04/07/2020 06/29/2019 04/26/2019  Decreased Interest 0 0 0  Down, Depressed, Hopeless 0 0 0  PHQ - 2 Score 0 0 0  Altered sleeping - - -  Tired, decreased energy - - -  Change in appetite - - -  Feeling bad or failure about yourself  - - -  Trouble concentrating - - -  Moving slowly or fidgety/restless - - -  Suicidal thoughts - - -  PHQ-9 Score - - -    Fall Risk  04/07/2020 09/07/2019 06/29/2019 04/26/2019 01/05/2019  Falls in the past year? 0 0 0 0 0  Number falls in past yr: 0 0 0 0 0  Injury with Fall? 0 0 0 0 0   Follow up Falls evaluation completed - - Falls evaluation completed -     No Known Allergies  Prior to Admission medications   Medication Sig Start Date End Date Taking? Authorizing Provider  acyclovir (ZOVIRAX) 400 MG tablet TAKE 1 TABLET BY MOUTH TWICE A DAY 03/05/20   GWendie Agreste MD  amLODipine (NORVASC) 5 MG tablet Take 1 tablet (5 mg total) by mouth daily. 06/29/19   SJacelyn Pi ILilia Argue MD  calcium carbonate (CALCIUM 600) 600 MG TABS tablet Take 600 mg by mouth 2 (two) times daily with a meal.    [provider]  Cyanocobalamin (B-12 PO) Take by mouth.    [provider]  estradiol (ESTRACE) 0.5 MG tablet TAKE 0.5 TABLETS (0.25 MG TOTAL) BY MOUTH EVERY OTHER DAY. 04/01/20   MMaximiano Coss NP  Iron-Vitamins (GERITOL PO) Take by mouth daily.    [provider]  Multiple Vitamin (MULTIVITAMIN WITH MINERALS) TABS tablet Take 1 tablet by mouth daily.    [provider]  omeprazole (PRILOSEC) 20 MG capsule Take 1 capsule (20 mg total) by mouth 2 (two) times daily before a meal. 02/20/20   GWendie Agreste MD  PREMARIN 0.3 MG tablet Take 0.3 mg by mouth daily. 05/11/18   [provider]  Past Medical History:  Diagnosis Date  . GERD (gastroesophageal reflux disease)   . Herpes   . Hypertension     Past Surgical History:  Procedure Laterality Date  . ABDOMINAL HYSTERECTOMY    . COLONOSCOPY      Social History   Tobacco Use  . Smoking status: Never Smoker  . Smokeless tobacco: Never Used  Substance Use Topics  . Alcohol use: No    Family History  Problem Relation Age of Onset  . Cancer Mother   . Stroke Mother   . Heart disease Mother   . Heart disease Father   . Cancer Father        prostate cancer  . Colon cancer Maternal Grandmother        pt unsure, MGM might have had colon CA  . Esophageal cancer Neg Hx   . Stomach cancer Neg Hx   . Rectal cancer Neg Hx     Review of Systems  Constitutional: Negative for  chills, fever and malaise/fatigue.  Respiratory: Negative for cough, shortness of breath and wheezing.   Cardiovascular: Negative for chest pain, palpitations and leg swelling.  Gastrointestinal: Positive for heartburn. Negative for abdominal pain, constipation, diarrhea, nausea and vomiting.  Genitourinary: Negative for dysuria, frequency and hematuria.  Musculoskeletal: Positive for joint pain (right shoulder pain). Negative for back pain.  Skin: Negative for rash.  Neurological: Negative for dizziness, weakness and headaches.     OBJECTIVE:  Today's Vitals   04/07/20 0810 04/07/20 0840  BP: (!) 159/71 (!) 146/74  Pulse: (!) 56   Temp: 97.6 F (36.4 C)   SpO2: 96%   Weight: 146 lb (66.2 kg)   Height: 5' 2"  (1.575 m)    Body mass index is 26.7 kg/m.   Physical Exam Constitutional:      General: She is not in acute distress.    Appearance: Normal appearance. She is not ill-appearing.  HENT:     Head: Normocephalic.  Cardiovascular:     Rate and Rhythm: Normal rate and regular rhythm.     Pulses: Normal pulses.     Heart sounds: Normal heart sounds. No murmur heard. No friction rub. No gallop.   Pulmonary:     Effort: Pulmonary effort is normal. No respiratory distress.     Breath sounds: Normal breath sounds. No stridor. No wheezing, rhonchi or rales.  Abdominal:     General: Bowel sounds are normal.     Palpations: Abdomen is soft.     Tenderness: There is no abdominal tenderness.  Musculoskeletal:     Right shoulder: Normal.     Left shoulder: Normal.     Right lower leg: No edema.     Left lower leg: No edema.  Skin:    General: Skin is warm and dry.  Neurological:     Mental Status: She is alert and oriented to person, place, and time.  Psychiatric:        Mood and Affect: Mood normal.        Behavior: Behavior normal.     No results found for this or any previous visit (from the past 24 hour(s)).  No results found.   ASSESSMENT and PLAN  Problem  List Items Addressed This Visit      Cardiovascular and Mediastinum   Essential hypertension - Primary   Relevant Medications   aspirin EC 81 MG tablet   amLODipine (NORVASC) 10 MG tablet   Other Relevant Orders   CMP14+EGFR  Genitourinary   Genital herpes simplex   Relevant Medications   acyclovir (ZOVIRAX) 400 MG tablet     Other   Hormone replacement therapy (postmenopausal)   Relevant Medications   estradiol (ESTRACE) 0.5 MG tablet    Other Visit Diagnoses    Prediabetes       Relevant Orders   Hemoglobin A1c   Lipid Panel   Screening for colon cancer       Relevant Orders   Ambulatory referral to Gastroenterology     Plan . Increase amlodipine from 5 mg to 10 mg . Will follow up with lab results . Encouraged to take BP at home   Return in about 6 months (around 10/08/2020).   Huston Foley Akaylah Lalley, FNP-BC Primary Care at [place], [place] [address] Ph.  [phone] Fax [phone]

## 2020-04-07 NOTE — Telephone Encounter (Signed)
Pt. Called following her visit, pt. Reported not feeling well and requested a work note. [phone]

## 2020-04-08 ENCOUNTER — Other Ambulatory Visit: Payer: Self-pay | Admitting: Family Medicine

## 2020-04-08 ENCOUNTER — Encounter: Payer: Self-pay | Admitting: Family Medicine

## 2020-04-08 LAB — CMP14+EGFR
ALT: 13 IU/L (ref 0–32)
AST: 18 IU/L (ref 0–40)
Albumin/Globulin Ratio: 1.7 (ref 1.2–2.2)
Albumin: 4.5 g/dL (ref 3.8–4.8)
Alkaline Phosphatase: 81 IU/L (ref 44–121)
BUN/Creatinine Ratio: 26 (ref 12–28)
BUN: 19 mg/dL (ref 8–27)
Bilirubin Total: 0.5 mg/dL (ref 0.0–1.2)
CO2: 22 mmol/L (ref 20–29)
Calcium: 9.8 mg/dL (ref 8.7–10.3)
Chloride: 101 mmol/L (ref 96–106)
Creatinine, Ser: 0.72 mg/dL (ref 0.57–1.00)
Globulin, Total: 2.6 g/dL (ref 1.5–4.5)
Glucose: 97 mg/dL (ref 65–99)
Potassium: 4 mmol/L (ref 3.5–5.2)
Sodium: 139 mmol/L (ref 134–144)
Total Protein: 7.1 g/dL (ref 6.0–8.5)
eGFR: 90 mL/min/1.73

## 2020-04-08 LAB — LIPID PANEL
Chol/HDL Ratio: 1.8 ratio (ref 0.0–4.4)
Cholesterol, Total: 165 mg/dL (ref 100–199)
HDL: 91 mg/dL (ref >39)
LDL Chol Calc (NIH): 67 mg/dL (ref 0–99)
Triglycerides: 28 mg/dL (ref 0–149)
VLDL Cholesterol Cal: 7 mg/dL (ref 5–40)

## 2020-04-08 LAB — HEMOGLOBIN A1C
Est. average glucose Bld gHb Est-mCnc: 117 mg/dL
Hgb A1c MFr Bld: 5.7 % — ABNORMAL HIGH (ref 4.8–5.6)

## 2020-04-09 ENCOUNTER — Other Ambulatory Visit: Payer: Self-pay | Admitting: Family Medicine

## 2020-04-09 ENCOUNTER — Telehealth: Payer: Self-pay

## 2020-04-09 DIAGNOSIS — R7303 Prediabetes: Secondary | ICD-10-CM

## 2020-04-09 NOTE — Progress Notes (Signed)
Overall your labs look good. Your A1c is elevated placing you in the pre-diabetes range. No medications are needed at this time, but continue to work on improving your diet and increasing exercise as able. Let me know if you would like a referral to a dietician to further discuss this.

## 2020-04-14 ENCOUNTER — Encounter: Payer: 59 | Admitting: Family Medicine

## 2020-04-14 NOTE — Telephone Encounter (Signed)
na

## 2020-04-17 ENCOUNTER — Encounter: Payer: Self-pay | Admitting: Family Medicine

## 2020-05-13 ENCOUNTER — Encounter: Payer: Self-pay | Admitting: Family Medicine

## 2020-05-13 ENCOUNTER — Ambulatory Visit: Payer: 59 | Admitting: Family Medicine

## 2020-05-13 ENCOUNTER — Other Ambulatory Visit: Payer: Self-pay

## 2020-05-13 VITALS — BP 124/64 | HR 64 | Ht 62.5 in | Wt 143.8 lb

## 2020-05-13 DIAGNOSIS — A6000 Herpesviral infection of urogenital system, unspecified: Secondary | ICD-10-CM

## 2020-05-13 DIAGNOSIS — K219 Gastro-esophageal reflux disease without esophagitis: Secondary | ICD-10-CM

## 2020-05-13 DIAGNOSIS — R7303 Prediabetes: Secondary | ICD-10-CM

## 2020-05-13 DIAGNOSIS — Z79890 Hormone replacement therapy (postmenopausal): Secondary | ICD-10-CM | POA: Diagnosis not present

## 2020-05-13 DIAGNOSIS — I10 Essential (primary) hypertension: Secondary | ICD-10-CM

## 2020-05-13 DIAGNOSIS — K635 Polyp of colon: Secondary | ICD-10-CM

## 2020-05-13 DIAGNOSIS — Z1211 Encounter for screening for malignant neoplasm of colon: Secondary | ICD-10-CM

## 2020-05-13 NOTE — Progress Notes (Signed)
   Subjective:    Patient ID: Rachel [name], female    DOB: February 16, 1950, 70 y.o.   MRN: [id number]  HPI Chief Complaint  Patient presents with  . new pt     New pt get established. Had a headache last Thursday and last through the night but went away the next day   She is new to the practice and here to establish care.  Previous medical care: [place]    Other providers: Dr. Matthew Saras - OB/GYN  [facility]   States she thinks she is overdue for her colonoscopy and has not heard from her gastroenterology office.  Reports having a polyp in the past.  States she had a severe left sided headache one day last week that worried her.  It resolved and she has not had a headache since.   HTN- reports good compliance with amlodipine   On hormone replacement therapy for menopause.   GERD- taking omeprazole daily.   Acyclovir -takes this daily to prevent genital herpes outbreaks and it is working.   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  Denies smoking, drinking alcohol, drug use    Reviewed allergies, medications, past medical, surgical, family, and social history.    Review of Systems Pertinent positives and negatives in the history of present illness.     Objective:   Physical Exam BP 124/64   Pulse 64   Ht 5' 2.5" (1.588 m)   Wt 143 lb 12.8 oz (65.2 kg)   LMP 08/01/2012   BMI 25.88 kg/m   Alert and in no distress.  Cardiac exam shows a regular sinus rhythm without murmurs or gallops. Lungs are clear to auscultation.  Extremities without edema.  Skin is warm and dry.  Normal mood and memory.       Assessment & Plan:  Essential hypertension -She is a pleasant 70 year old female who is new to the practice here to establish care.  Her blood pressure is well controlled and she should continue her current medications.  Recommend low-sodium diet.  Prediabetes -Reviewed labs from March  2022 and her hemoglobin A1c was 5.7%.  Continue eating a low-carb diet and getting plenty of physical activity.  Hormone replacement therapy (postmenopausal) -She has been on HRT for several years.  She has an upcoming appointment with her OB/GYN and I did recommend that the OB/GYN start prescribing this for her and decide when to get her off of the therapy.  Polyp of colon, unspecified part of colon, unspecified type - Plan: Ambulatory referral to Gastroenterology -Referral back to GI  Screen for colon cancer - Plan: Ambulatory referral to Gastroenterology -She appears to be overdue for her screening colonoscopy.  Referral made  Gastroesophageal reflux disease without esophagitis - Plan: Ambulatory referral to Gastroenterology -Counseling on lifestyle modifications to help control GERD.  Recommend she take PPI as needed and not daily.  Follow-up with GI  Genital herpes simplex, unspecified site -Continue on acyclovir

## 2020-06-03 ENCOUNTER — Ambulatory Visit: Payer: 59 | Admitting: Family Medicine

## 2020-06-03 ENCOUNTER — Encounter: Payer: Self-pay | Admitting: Family Medicine

## 2020-06-03 VITALS — BP 138/72 | HR 59 | Temp 96.9°F | Ht 62.0 in | Wt 142.2 lb

## 2020-06-03 DIAGNOSIS — B9689 Other specified bacterial agents as the cause of diseases classified elsewhere: Secondary | ICD-10-CM | POA: Diagnosis not present

## 2020-06-03 DIAGNOSIS — N760 Acute vaginitis: Secondary | ICD-10-CM

## 2020-06-03 MED ORDER — METRONIDAZOLE 500 MG PO TABS: 500.0000 mg | ORAL_TABLET | Freq: Two times a day (BID) | ORAL | 0 refills | Status: AC

## 2020-06-03 NOTE — Progress Notes (Signed)
   Subjective:    Patient ID: Rachel [name], female    DOB: [birth year], 70 y.o.   MRN: [id number]  HPI She complains of a several day history of vaginal itching.  She recently stopped taking acyclovir and is concerned about recurrence of herpes.   Review of Systems     Objective:   Physical Exam Vaginal exam shows no external lesions.  No discharge was noted.  KOH and wet prep was negative.       Assessment & Plan:  Bacterial vaginosis - Plan: metroNIDAZOLE (FLAGYL) 500 MG tablet I explained that I thought this was a bacterial infection and not related to herpes.  Since she has been off the medication, I told her if it was okay to stay off of it.  If she has a reoccurrence of herpes symptoms, we can reevaluate at that time.

## 2020-08-13 ENCOUNTER — Other Ambulatory Visit: Payer: Self-pay

## 2020-08-13 ENCOUNTER — Ambulatory Visit (HOSPITAL_COMMUNITY)
Admission: EM | Admit: 2020-08-13 | Discharge: 2020-08-13 | Disposition: A | Discharge status: Home / Self Care | Payer: 59 | Attending: Internal Medicine | Admitting: Internal Medicine

## 2020-08-13 DIAGNOSIS — Z20822 Contact with and (suspected) exposure to covid-19: Secondary | ICD-10-CM | POA: Insufficient documentation

## 2020-08-13 NOTE — ED Triage Notes (Signed)
Pt presents for covid testing with no known symptoms. 

## 2020-08-14 LAB — SARS CORONAVIRUS 2 (TAT 6-24 HRS): SARS Coronavirus 2: NEGATIVE

## 2020-09-16 ENCOUNTER — Other Ambulatory Visit: Payer: Self-pay | Admitting: Family Medicine

## 2020-09-23 ENCOUNTER — Other Ambulatory Visit: Payer: Self-pay | Admitting: Family Medicine

## 2020-09-23 ENCOUNTER — Telehealth: Payer: Self-pay | Admitting: Family Medicine

## 2020-09-23 MED ORDER — OMEPRAZOLE 20 MG PO CPDR: 20.0000 mg | DELAYED_RELEASE_CAPSULE | Freq: Two times a day (BID) | ORAL | 0 refills | Status: DC

## 2020-09-23 NOTE — Telephone Encounter (Signed)
Pt stopped by and is needing a refill [facility] [address]

## 2020-09-24 ENCOUNTER — Ambulatory Visit: Payer: 59 | Admitting: Family Medicine

## 2020-09-24 ENCOUNTER — Ambulatory Visit
Admission: RE | Admit: 2020-09-24 | Discharge: 2020-09-24 | Disposition: A | Discharge status: Home / Self Care | Payer: 59 | Source: Ambulatory Visit | Attending: Family Medicine | Admitting: Family Medicine

## 2020-09-24 ENCOUNTER — Encounter: Payer: Self-pay | Admitting: Family Medicine

## 2020-09-24 ENCOUNTER — Other Ambulatory Visit: Payer: Self-pay

## 2020-09-24 VITALS — BP 132/80 | HR 88 | Temp 98.0°F | Wt 144.0 lb

## 2020-09-24 DIAGNOSIS — G8929 Other chronic pain: Secondary | ICD-10-CM

## 2020-09-24 DIAGNOSIS — M25511 Pain in right shoulder: Secondary | ICD-10-CM

## 2020-09-24 DIAGNOSIS — R0789 Other chest pain: Secondary | ICD-10-CM

## 2020-09-24 DIAGNOSIS — F5104 Psychophysiologic insomnia: Secondary | ICD-10-CM

## 2020-09-24 MED ORDER — DICLOFENAC SODIUM 1 % EX GEL: 2.0000 g | Freq: Four times a day (QID) | CUTANEOUS | 0 refills | Status: DC

## 2020-09-24 NOTE — Progress Notes (Signed)
Subjective:    Patient ID: Rachel [name], female    DOB: [birth year], 70 y.o.   MRN: [id number]  HPI Chief Complaint  Patient presents with   other    Pain on lt.  Side on Sunday, rt. Shoulder pain, mind keeps going at night time having trouble sleeping. Pt. Refused flu shot.    Complains of right shoulder pain for years. Using a topical pain medicine for it.  States the nurse at work checked it in the past and said she had arthritis.   States denies numbness or tingling.  Pain is keeping her awake at night. States she is not in favor of taking OTC pain medication.    States she has a history of similar pain and was diagnosed with costochondritis.   Complains of an intermittent sharp left lower chest wall pain while sweeping. Pain moved from her sternum to under her left breast. No fever, chills, dizziness, palpitations, cough, shortness of breath, diaphoresis, N/V.   She took a PPI in case her pain was related to acid reflux. It did help.   States she has trouble sleeping. Sleeps with her TV on. Has taken trazodone in the past but this made her too drowsy during the day also.   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   Reviewed allergies, medications, past medical, surgical, family, and social history.    Review of Systems Pertinent positives and negatives in the history of present illness.     Objective:   Physical Exam Constitutional:      General: She is not in acute distress.    Appearance: Normal appearance. She is not ill-appearing.  Cardiovascular:     Rate and Rhythm: Normal rate and regular rhythm.     Pulses: Normal pulses.     Heart sounds: Normal heart sounds.  Pulmonary:     Effort: Pulmonary effort is normal.     Breath sounds: Normal breath sounds.  Chest:     Chest wall: Tenderness present.       Comments: Tenderness to palpations with certain movements  Abdominal:     General: Abdomen is flat.     Palpations: Abdomen is soft.   Musculoskeletal:     Cervical back: Normal range of motion.     Right lower leg: No edema.     Left lower leg: No edema.  Skin:    General: Skin is warm and dry.     Capillary Refill: Capillary refill takes less than 2 seconds.  Neurological:     General: No focal deficit present.     Mental Status: She is alert and oriented to person, place, and time.     Motor: No weakness.  Psychiatric:        Mood and Affect: Mood normal.        Thought Content: Thought content normal.   BP 132/80   Pulse 88   Temp 98 F (36.7 C)   Wt 144 lb (65.3 kg)   LMP 08/01/2012   BMI 26.34 kg/m         Assessment & Plan:  Chronic right shoulder pain - Plan: DG Shoulder Right, diclofenac Sodium (VOLTAREN) 1 % GEL, Ambulatory referral to Physical Therapy  Musculoskeletal chest pain - Plan: CBC with Differential/Platelet, Comprehensive metabolic panel, DG Chest 2 View, EKG 12-Lead  Psychophysiological insomnia - Plan: CBC with Differential/Platelet, Comprehensive metabolic panel  EKG shows NSR rate 64 and unremarkable. Read by myself and Dr. Redmond School.  Suspect bursitis of  arthritis. Voltaren gel prescribed. She may take oral NSAIDs if needed but is not in favor.  XR ordered and PT as well.  Discussed that her chest pain appears to be MSK and not cardiac. She was advised to try an NSAID if needed.  Follow up if any new or worsening symptoms arise.  Counseling on good sleep hygiene. May try melatonin 3 mg.

## 2020-09-24 NOTE — Patient Instructions (Signed)
Turn TV off when you go to bed.   You may try melatonin over the counter. Start with 3 mg.   Go to [facility] for your shoulder and chest X rays.   I am referring you to physical therapy for your shoulder and they will call you to schedule.

## 2020-09-25 ENCOUNTER — Encounter: Payer: Self-pay | Admitting: Family Medicine

## 2020-09-25 LAB — COMPREHENSIVE METABOLIC PANEL
ALT: 15 IU/L (ref 0–32)
AST: 19 IU/L (ref 0–40)
Albumin/Globulin Ratio: 1.9 (ref 1.2–2.2)
Albumin: 4.8 g/dL (ref 3.8–4.8)
Alkaline Phosphatase: 78 IU/L (ref 44–121)
BUN/Creatinine Ratio: 24 (ref 12–28)
BUN: 19 mg/dL (ref 8–27)
Bilirubin Total: 0.4 mg/dL (ref 0.0–1.2)
CO2: 26 mmol/L (ref 20–29)
Calcium: 10 mg/dL (ref 8.7–10.3)
Chloride: 106 mmol/L (ref 96–106)
Creatinine, Ser: 0.8 mg/dL (ref 0.57–1.00)
Globulin, Total: 2.5 g/dL (ref 1.5–4.5)
Glucose: 85 mg/dL (ref 65–99)
Potassium: 4.4 mmol/L (ref 3.5–5.2)
Sodium: 146 mmol/L — ABNORMAL HIGH (ref 134–144)
Total Protein: 7.3 g/dL (ref 6.0–8.5)
eGFR: 79 mL/min/{1.73_m2} (ref >59)

## 2020-09-25 LAB — CBC WITH DIFFERENTIAL/PLATELET
Basophils Absolute: 0.1 10*3/uL (ref 0.0–0.2)
Basos: 1 %
EOS (ABSOLUTE): 0.1 10*3/uL (ref 0.0–0.4)
Eos: 1 %
Hematocrit: 36.6 % (ref 34.0–46.6)
Hemoglobin: 12.2 g/dL (ref 11.1–15.9)
Immature Grans (Abs): 0 10*3/uL (ref 0.0–0.1)
Immature Granulocytes: 0 %
Lymphocytes Absolute: 2.1 10*3/uL (ref 0.7–3.1)
Lymphs: 31 %
MCH: 28.6 pg (ref 26.6–33.0)
MCHC: 33.3 g/dL (ref 31.5–35.7)
MCV: 86 fL (ref 79–97)
Monocytes Absolute: 0.7 10*3/uL (ref 0.1–0.9)
Monocytes: 10 %
Neutrophils Absolute: 4 10*3/uL (ref 1.4–7.0)
Neutrophils: 57 %
Platelets: 199 10*3/uL (ref 150–450)
RBC: 4.26 x10E6/uL (ref 3.77–5.28)
RDW: 12.7 % (ref 11.7–15.4)
WBC: 6.9 10*3/uL (ref 3.4–10.8)

## 2020-09-25 NOTE — Progress Notes (Signed)
Her chest XR is fine, negative for anything worrisome.

## 2020-09-25 NOTE — Progress Notes (Signed)
Her labs are fine but her sodium is just a little bit elevated. Make sure she is drinking fluids, at least 6-8 eight ounce glasses of water per day.

## 2020-09-28 NOTE — Progress Notes (Signed)
Her shoulder XR shows some chronic disease including joint degeneration, bone spurs and sclerotic changes. She may have rotator cuff impingement. I recommend a referral to an orthopedist. Please assist her with this.  Also, has she ever had a bone density study? I do not see one in her chart. If not, ok to order this at the breast center due to estrogen deficiency.

## 2020-12-08 ENCOUNTER — Other Ambulatory Visit: Payer: Self-pay

## 2020-12-08 MED ORDER — OMEPRAZOLE 20 MG PO CPDR: 20.0000 mg | DELAYED_RELEASE_CAPSULE | Freq: Two times a day (BID) | ORAL | 0 refills | Status: DC

## 2020-12-29 LAB — HM DEXA SCAN: HM Dexa Scan: NORMAL

## 2020-12-30 ENCOUNTER — Ambulatory Visit (INDEPENDENT_AMBULATORY_CARE_PROVIDER_SITE_OTHER): Payer: 59 | Admitting: Family Medicine

## 2020-12-30 ENCOUNTER — Other Ambulatory Visit: Payer: Self-pay

## 2020-12-30 ENCOUNTER — Telehealth: Payer: Self-pay

## 2020-12-30 ENCOUNTER — Encounter: Payer: Self-pay | Admitting: Family Medicine

## 2020-12-30 VITALS — Ht 62.0 in | Wt 140.0 lb

## 2020-12-30 DIAGNOSIS — U071 COVID-19: Secondary | ICD-10-CM

## 2020-12-30 NOTE — Progress Notes (Signed)
   Subjective:    Patient ID: Rachel [name], female    DOB: [birth year], 70 y.o.   MRN: [id number]  HPI Documentation for virtual audio telecommunications through [employer] encounter: Unable to do video The patient was located at home. 2 patient identifiers used.  The provider was located in the office. The patient did consent to this visit and is aware of possible charges through their insurance for this visit. The other persons participating in this telemedicine service were none. Time spent on call was 4 minutes and in review of previous records >19 minutes total for counseling and coordination of care. This virtual service is not related to other E/M service within previous 7 days.  She states that on Saturday she developed fever and chills with fatigue and coughing and now is also feeling some shoulder discomfort.  She was seen in an urgent care yesterday and told the COVID test was positive.  She has been using Mucinex but is taking nothing for the aches and pains.  She has had no sore throat, earache, smell or taste change.  Review of Systems     Objective:   Physical Exam Alert and in no distress.  Respiratory pattern appears normal       Assessment & Plan:  COVID-19 Recommend fluids, continue with Mucinex, 2 Tylenol 4 times per day as needed for the aches and pains.  She is to stay sequestered till Friday and then from Friday on she can wear a mask.  Explained that if she gets worsening of fever, cough and shortness of breath to call us.  She expressed understanding of this.

## 2020-12-30 NOTE — Telephone Encounter (Signed)
Pt would like a doctors note for work . Pt is covid positive. Rachel [name]

## 2020-12-31 NOTE — Telephone Encounter (Signed)
Done and pt was advised to have someone to pick up. Rachel [name]

## 2021-01-05 ENCOUNTER — Telehealth: Payer: Self-pay | Admitting: Family Medicine

## 2021-01-05 NOTE — Telephone Encounter (Signed)
Pt called back and states she just took a COVID test and it was positive.

## 2021-01-05 NOTE — Telephone Encounter (Signed)
Pt called and states that she still feels really bad. She has a sore throat, body aches and very tried. She is to return to work today and is still very sick. She is requesting something that can make her feel better. Pt uses [facility] on [address]. She can be reached at [phone].

## 2021-01-06 NOTE — Telephone Encounter (Signed)
Pt called and message was relayed. Pt states that alternating tylenol and ibuprofen is helping control her temp. She does still have a lot of mucus that mucinex seems to be helping a little. She is having head and chest pain. Pt uses [facility] [address] and can be reached at [phone],

## 2021-01-06 NOTE — Telephone Encounter (Signed)
LVM for pt to call back for info . [place]

## 2021-01-15 ENCOUNTER — Encounter: Payer: Self-pay | Admitting: Family Medicine

## 2021-01-15 ENCOUNTER — Other Ambulatory Visit: Payer: Self-pay

## 2021-01-15 ENCOUNTER — Ambulatory Visit: Payer: 59 | Admitting: Family Medicine

## 2021-01-15 VITALS — BP 132/80 | HR 64 | Temp 97.8°F | Wt 146.6 lb

## 2021-01-15 DIAGNOSIS — M940 Chondrocostal junction syndrome [Tietze]: Secondary | ICD-10-CM

## 2021-01-15 DIAGNOSIS — Z23 Encounter for immunization: Secondary | ICD-10-CM | POA: Diagnosis not present

## 2021-01-15 NOTE — Progress Notes (Signed)
° °  Subjective:    Patient ID: Rachel [name], female    DOB: [birth year], 70 y.o.   MRN: [id number]  HPI She complains that approximately 2 days ago she woke up with some anterior chest pain that has been intermittent in nature that last usually just for 3 minutes.  She notes that especially when she lies down.  There has been no associated weakness, diaphoresis, shortness of breath.  She has not had any injuries to this area.  She cannot say whether movement or breathing has had any effect on it.   Review of Systems     Objective:   Physical Exam Alert and in no distress.  Slight tenderness palpation over the left and right third costochondral junctions.  Cardiac exam shows regular rhythm without murmurs or gallops.  Lungs are clear to auscultation.       Assessment & Plan:  Costochondritis  Immunization, viral disease - Plan: Pension scheme manager I explained that I thought this was costochondritis which is an irritation and recommend 2 Tylenol 4 times per day for the next week. Also discussed getting the COVID-vaccine since she is only had 2.  Explained that she could indeed get fever aches and pains for a few days but certainly better than getting COVID.

## 2021-01-29 ENCOUNTER — Encounter: Payer: Self-pay | Admitting: Family Medicine

## 2021-01-29 ENCOUNTER — Other Ambulatory Visit: Payer: Self-pay

## 2021-01-29 ENCOUNTER — Ambulatory Visit (INDEPENDENT_AMBULATORY_CARE_PROVIDER_SITE_OTHER): Payer: 59 | Admitting: Family Medicine

## 2021-01-29 VITALS — BP 130/70 | HR 57 | Temp 96.7°F | Wt 144.4 lb

## 2021-01-29 DIAGNOSIS — K219 Gastro-esophageal reflux disease without esophagitis: Secondary | ICD-10-CM

## 2021-01-29 NOTE — Progress Notes (Signed)
° °  Subjective:    Patient ID: Rachel [name], female    DOB: 08-14-1950, 71 y.o.   MRN: [id number]  HPI She is here for evaluation of a 2-week history of intermittent abdominal pain and bloating.  She does have a previous history of reflux disease and was on Prilosec.  She has not tried any of that recently.  She does note that milk does make this worse   Review of Systems     Objective:   Physical Exam Cardiac exam shows regular rhythm without murmurs gallops.  Lungs are clear to auscultation.  Abdominal exam shows some slight midepigastric discomfort.       Assessment & Plan:  Gastroesophageal reflux disease, unspecified whether esophagitis present Start back taking Prilosec daily for the next couple of weeks and see if that quiets it down pay attention to see if milk or milk products cause trouble.  If that is the case you should either avoid them or try some Lactaid If he have any other trouble with this keep track of anything that makes it better or worse

## 2021-01-29 NOTE — Patient Instructions (Signed)
Start back taking Prilosec daily for the next couple of weeks and see if that quiets it down pay attention to see if milk or milk products cause trouble.  If that is the case you should either avoid them or try some Lactaid If he have any other trouble with this keep track of anything that makes it better or worse

## 2021-04-07 ENCOUNTER — Other Ambulatory Visit: Payer: Self-pay

## 2021-04-07 DIAGNOSIS — I10 Essential (primary) hypertension: Secondary | ICD-10-CM

## 2021-04-07 MED ORDER — AMLODIPINE BESYLATE 10 MG PO TABS: 10.0000 mg | ORAL_TABLET | Freq: Every day | ORAL | 0 refills | Status: DC

## 2021-04-09 ENCOUNTER — Ambulatory Visit: Payer: 59 | Admitting: Physician Assistant

## 2021-04-09 ENCOUNTER — Encounter: Payer: Self-pay | Admitting: Physician Assistant

## 2021-04-09 ENCOUNTER — Other Ambulatory Visit: Payer: Self-pay

## 2021-04-09 VITALS — BP 140/78 | HR 58 | Ht 62.0 in | Wt 144.0 lb

## 2021-04-09 DIAGNOSIS — I10 Essential (primary) hypertension: Secondary | ICD-10-CM | POA: Diagnosis not present

## 2021-04-09 DIAGNOSIS — R7303 Prediabetes: Secondary | ICD-10-CM | POA: Diagnosis not present

## 2021-04-09 DIAGNOSIS — K219 Gastro-esophageal reflux disease without esophagitis: Secondary | ICD-10-CM

## 2021-04-09 DIAGNOSIS — Z1211 Encounter for screening for malignant neoplasm of colon: Secondary | ICD-10-CM | POA: Diagnosis not present

## 2021-04-09 MED ORDER — AMLODIPINE BESYLATE 10 MG PO TABS: 10.0000 mg | ORAL_TABLET | Freq: Every day | ORAL | 1 refills | Status: DC

## 2021-04-09 NOTE — Assessment & Plan Note (Signed)
controlled, continue taking amlodipine 10 mg qd, eat a low salt diet, do not add any salt to food when cooking, avoid processed foods, avoid fried foods ? ?

## 2021-04-09 NOTE — Progress Notes (Signed)
? ?Established Patient Office Visit ? ?Subjective:  ?Patient ID: Rachel [name], female    DOB: 1950/04/29  Age: 71 y.o. MRN: [id number] ? ?CC:  ?Chief Complaint  ?Patient presents with  ? Medication Refill  ?  Medcheck- pt also said earlier this week she had chest pains but none today. Pt also has acid reflux  ? ? ?HPI ?Rachel [name] presents for a follow up appointment.  ? ? ? ?Outpatient Medications Prior to Visit  ?Medication Sig Dispense Refill  ? aspirin EC 81 MG tablet Take 81 mg by mouth daily. Swallow whole.    ? calcium carbonate (OS-CAL) 600 MG TABS tablet Take 600 mg by mouth 2 (two) times daily with a meal.    ? Cyanocobalamin (B-12 PO) Take by mouth.    ? Iron-Vitamins (GERITOL PO) Take by mouth daily.    ? Multiple Vitamin (MULTIVITAMIN WITH MINERALS) TABS tablet Take 1 tablet by mouth daily.    ? omeprazole (PRILOSEC) 20 MG capsule Take 1 capsule (20 mg total) by mouth 2 (two) times daily before a meal. 180 capsule 0  ? amLODipine (NORVASC) 10 MG tablet Take 1 tablet (10 mg total) by mouth daily. 30 tablet 0  ? acyclovir (ZOVIRAX) 400 MG tablet Take 1 tablet (400 mg total) by mouth 2 (two) times daily. (Patient not taking: Reported on 09/24/2020) 60 tablet 0  ? diclofenac Sodium (VOLTAREN) 1 % GEL Apply 2 g topically 4 (four) times daily. (Patient not taking: Reported on 04/09/2021) 100 g 0  ? ?No facility-administered medications prior to visit.  ? ? ?No Known Allergies ? ?ROS ?Review of Systems  ?Constitutional:  Negative for activity change and chills.  ?HENT:  Negative for congestion and voice change.   ?Eyes:  Negative for pain and redness.  ?Respiratory:  Negative for cough and wheezing.   ?Cardiovascular:  Negative for chest pain.  ?Gastrointestinal:  Negative for constipation, diarrhea, nausea and vomiting.  ?Endocrine: Negative for polyuria.  ?Genitourinary:  Negative for frequency.  ?Skin:  Negative for color change and rash.  ?Allergic/Immunologic: Negative for immunocompromised  state.  ?Neurological:  Negative for dizziness.  ?Psychiatric/Behavioral:  Negative for agitation.   ? ?  ?Objective:  ?  ?Physical Exam ?Vitals and nursing note reviewed.  ?Constitutional:   ?   General: She is not in acute distress. ?   Appearance: She is normal weight. She is not ill-appearing.  ?HENT:  ?   Head: Normocephalic and atraumatic.  ?   Right Ear: External ear normal.  ?   Left Ear: External ear normal.  ?Eyes:  ?   Extraocular Movements: Extraocular movements intact.  ?   Conjunctiva/sclera: Conjunctivae normal.  ?   Pupils: Pupils are equal, round, and reactive to light.  ?Cardiovascular:  ?   Rate and Rhythm: Normal rate and regular rhythm.  ?Pulmonary:  ?   Effort: Pulmonary effort is normal.  ?   Breath sounds: Normal breath sounds. No wheezing.  ?Abdominal:  ?   General: Bowel sounds are normal.  ?   Palpations: Abdomen is soft.  ?Musculoskeletal:     ?   General: Normal range of motion.  ?   Cervical back: Normal range of motion.  ?Skin: ?   General: Skin is warm and dry.  ?Neurological:  ?   Mental Status: She is alert and oriented to person, place, and time.  ?Psychiatric:     ?   Mood and Affect: Mood normal.  ? ? ?BP  140/78   Pulse (!) 58   Ht 5' 2" (1.575 m)   Wt 144 lb (65.3 kg)   LMP 08/01/2012   SpO2 99%   BMI 26.34 kg/m?  ? ?Wt Readings from Last 3 Encounters:  ?04/09/21 144 lb (65.3 kg)  ?01/29/21 144 lb 6.4 oz (65.5 kg)  ?01/15/21 146 lb 9.6 oz (66.5 kg)  ? ? ?Results for orders placed or performed in visit on 09/24/20  ?CBC with Differential/Platelet  ?Result Value Ref Range  ? WBC 6.9 3.4 - 10.8 x10E3/uL  ? RBC 4.26 3.77 - 5.28 x10E6/uL  ? Hemoglobin 12.2 11.1 - 15.9 g/dL  ? Hematocrit 36.6 34.0 - 46.6 %  ? MCV 86 79 - 97 fL  ? MCH 28.6 26.6 - 33.0 pg  ? MCHC 33.3 31.5 - 35.7 g/dL  ? RDW 12.7 11.7 - 15.4 %  ? Platelets 199 150 - 450 x10E3/uL  ? Neutrophils 57 Not Estab. %  ? Lymphs 31 Not Estab. %  ? Monocytes 10 Not Estab. %  ? Eos 1 Not Estab. %  ? Basos 1 Not Estab. %  ?  Neutrophils Absolute 4.0 1.4 - 7.0 x10E3/uL  ? Lymphocytes Absolute 2.1 0.7 - 3.1 x10E3/uL  ? Monocytes Absolute 0.7 0.1 - 0.9 x10E3/uL  ? EOS (ABSOLUTE) 0.1 0.0 - 0.4 x10E3/uL  ? Basophils Absolute 0.1 0.0 - 0.2 x10E3/uL  ? Immature Granulocytes 0 Not Estab. %  ? Immature Grans (Abs) 0.0 0.0 - 0.1 x10E3/uL  ?Comprehensive metabolic panel  ?Result Value Ref Range  ? Glucose 85 65 - 99 mg/dL  ? BUN 19 8 - 27 mg/dL  ? Creatinine, Ser 0.80 0.57 - 1.00 mg/dL  ? eGFR 79 >59 mL/min/1.73  ? BUN/Creatinine Ratio 24 12 - 28  ? Sodium 146 (H) 134 - 144 mmol/L  ? Potassium 4.4 3.5 - 5.2 mmol/L  ? Chloride 106 96 - 106 mmol/L  ? CO2 26 20 - 29 mmol/L  ? Calcium 10.0 8.7 - 10.3 mg/dL  ? Total Protein 7.3 6.0 - 8.5 g/dL  ? Albumin 4.8 3.8 - 4.8 g/dL  ? Globulin, Total 2.5 1.5 - 4.5 g/dL  ? Albumin/Globulin Ratio 1.9 1.2 - 2.2  ? Bilirubin Total 0.4 0.0 - 1.2 mg/dL  ? Alkaline Phosphatase 78 44 - 121 IU/L  ? AST 19 0 - 40 IU/L  ? ALT 15 0 - 32 IU/L  ?  ? ? ? ?The 10-year ASCVD risk score (Arnett DK, et al., 2019) is: 13.4% ? ?  ?Assessment & Plan:  ? ?Problem List Items Addressed This Visit   ? ?  ? Cardiovascular and Mediastinum  ? Essential hypertension - Primary  ?  controlled, continue taking amlodipine 10 mg qd, eat a low salt diet, do not add any salt to food when cooking, avoid processed foods, avoid fried foods ? ?  ?  ? Relevant Medications  ? amLODipine (NORVASC) 10 MG tablet  ?  ? Digestive  ? GERD without esophagitis  ?  stable, continue prilosec 20 mg bid, avoid stomach irritants (tomatoes, oranges, lemons, limes, spicy/greasy food) ? ?  ?  ?  ? Other  ? Prediabetes  ?  controlled, eat a low sugar diet, avoid starchy food with a lot of carbohydrates, avoid fried and processed foods; last hgb a1c 5.7  04/08/2020 ?  ?  ? ?Other Visit Diagnoses   ? ? Screen for colon cancer      ? Relevant Orders  ? Ambulatory  referral to Gastroenterology  ? ?  ? ? ?Meds ordered this encounter  ?Medications  ? amLODipine (NORVASC) 10 MG  tablet  ?  Sig: Take 1 tablet (10 mg total) by mouth daily.  ?  Dispense:  90 tablet  ?  Refill:  1  ?  Order Specific Question:   Supervising Provider  ?  Answer:   Denita Lung [[id number]]  ? ? ?Follow-up: Return in about 6 months (around 10/10/2021) for Return for Annual Exam.  ? ? ?Irene Pap, PA-C ?

## 2021-04-09 NOTE — Patient Instructions (Addendum)
Eat a low salt diet and minimize processed foods (examples canned vegetables, lunch meats, fast food), drink 8 glasses of water a day, exercise 3 - 5 times a week (example walking 30 minutes daily) ? ?You will get a call to schedule an appointment for a colonoscopy ? ?

## 2021-04-09 NOTE — Assessment & Plan Note (Signed)
controlled, eat a low sugar diet, avoid starchy food with a lot of carbohydrates, avoid fried and processed foods; last hgb a1c 5.7  04/08/2020 ?

## 2021-04-09 NOTE — Assessment & Plan Note (Signed)
stable, continue prilosec 20 mg bid, avoid stomach irritants (tomatoes, oranges, lemons, limes, spicy/greasy food) ? ?

## 2021-06-03 ENCOUNTER — Emergency Department (HOSPITAL_BASED_OUTPATIENT_CLINIC_OR_DEPARTMENT_OTHER)
Admission: EM | Admit: 2021-06-03 | Discharge: 2021-06-04 | Disposition: A | Discharge status: Home / Self Care | Payer: Worker's Compensation | Attending: Emergency Medicine | Admitting: Emergency Medicine

## 2021-06-03 ENCOUNTER — Encounter (HOSPITAL_BASED_OUTPATIENT_CLINIC_OR_DEPARTMENT_OTHER): Payer: Self-pay | Admitting: Emergency Medicine

## 2021-06-03 ENCOUNTER — Emergency Department (HOSPITAL_BASED_OUTPATIENT_CLINIC_OR_DEPARTMENT_OTHER): Payer: Self-pay

## 2021-06-03 ENCOUNTER — Emergency Department (HOSPITAL_BASED_OUTPATIENT_CLINIC_OR_DEPARTMENT_OTHER): Payer: Self-pay | Attending: Emergency Medicine

## 2021-06-03 DIAGNOSIS — W19XXXA Unspecified fall, initial encounter: Secondary | ICD-10-CM | POA: Insufficient documentation

## 2021-06-03 DIAGNOSIS — M25561 Pain in right knee: Secondary | ICD-10-CM | POA: Insufficient documentation

## 2021-06-03 DIAGNOSIS — M25562 Pain in left knee: Secondary | ICD-10-CM | POA: Insufficient documentation

## 2021-06-03 DIAGNOSIS — Z7982 Long term (current) use of aspirin: Secondary | ICD-10-CM | POA: Diagnosis not present

## 2021-06-03 DIAGNOSIS — Z79899 Other long term (current) drug therapy: Secondary | ICD-10-CM | POA: Diagnosis not present

## 2021-06-03 MED ORDER — ACETAMINOPHEN 500 MG PO TABS
1000.0000 mg | ORAL_TABLET | Freq: Once | ORAL | Status: AC
  Administered 2021-06-03: 1000 mg via ORAL
  Filled 2021-06-03: qty 2

## 2021-06-03 MED ORDER — IBUPROFEN 800 MG PO TABS
800.0000 mg | ORAL_TABLET | Freq: Once | ORAL | Status: AC
  Administered 2021-06-03: 800 mg via ORAL
  Filled 2021-06-03: qty 1

## 2021-06-03 NOTE — ED Triage Notes (Signed)
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

## 2021-06-04 MED ORDER — IBUPROFEN 800 MG PO TABS: 800.0000 mg | ORAL_TABLET | Freq: Three times a day (TID) | ORAL | 0 refills | Status: DC

## 2021-06-04 NOTE — ED Provider Notes (Signed)
[facility] Provider Note   CSN: [id number] Arrival date & time: 06/03/21  2117     History  Chief Complaint  Patient presents with   Rachel [name] is a 71 y.o. female.  The history is provided by the patient.  Fall This is a new problem. The current episode started 6 to 12 hours ago. The problem occurs constantly. The problem has been resolved. Pertinent negatives include no chest pain, no abdominal pain, no headaches and no shortness of breath. Nothing aggravates the symptoms. Nothing relieves the symptoms. She has tried nothing for the symptoms. The treatment provided no relief.  Fell onto knees at work and turned head so teeth would not get chipped.  Is on ASA.      Home Medications Prior to Admission medications   Medication Sig Start Date End Date Taking? Authorizing Provider  acyclovir (ZOVIRAX) 400 MG tablet Take 1 tablet (400 mg total) by mouth 2 (two) times daily. Patient not taking: Reported on 09/24/2020 04/07/20   Just, Laurita Quint, FNP  amLODipine (NORVASC) 10 MG tablet Take 1 tablet (10 mg total) by mouth daily. 04/09/21   Irene Pap, PA-C  aspirin EC 81 MG tablet Take 81 mg by mouth daily. Swallow whole.    [provider]  calcium carbonate (OS-CAL) 600 MG TABS tablet Take 600 mg by mouth 2 (two) times daily with a meal.    [provider]  Cyanocobalamin (B-12 PO) Take by mouth.    [provider]  diclofenac Sodium (VOLTAREN) 1 % GEL Apply 2 g topically 4 (four) times daily. Patient not taking: Reported on 04/09/2021 09/24/20   Harland Dingwall L, NP-C  Iron-Vitamins (GERITOL PO) Take by mouth daily.    [provider]  Multiple Vitamin (MULTIVITAMIN WITH MINERALS) TABS tablet Take 1 tablet by mouth daily.    [provider]  omeprazole (PRILOSEC) 20 MG capsule Take 1 capsule (20 mg total) by mouth 2 (two) times daily before a meal. 12/08/20   Tysinger, Camelia Eng, PA-C       Allergies    Patient has no known allergies.    Review of Systems   Review of Systems  Constitutional:  Negative for fever.  HENT:  Negative for facial swelling.   Eyes:  Negative for photophobia.  Respiratory:  Negative for shortness of breath.   Cardiovascular:  Negative for chest pain.  Gastrointestinal:  Negative for abdominal pain.  Musculoskeletal:  Negative for neck stiffness.  Skin:  Negative for rash.  Neurological:  Negative for facial asymmetry, weakness, numbness and headaches.  All other systems reviewed and are negative.  Physical Exam Updated Vital Signs BP (!) 150/72 (BP Location: Right Arm)   Pulse 75   Temp 98.2 F (36.8 C) (Oral)   Resp 18   Ht '5\' 2"'$  (1.575 m)   Wt 65.8 kg   LMP 08/01/2012   SpO2 100%   BMI 26.52 kg/m  Physical Exam Vitals and nursing note reviewed.  Constitutional:      General: She is not in acute distress.    Appearance: Normal appearance.  HENT:     Head: Normocephalic and atraumatic.     Nose: Nose normal.  Eyes:     Conjunctiva/sclera: Conjunctivae normal.     Pupils: Pupils are equal, round, and reactive to light.  Cardiovascular:     Rate and Rhythm: Normal rate and regular rhythm.     Pulses: Normal pulses.  Heart sounds: Normal heart sounds.  Pulmonary:     Effort: Pulmonary effort is normal.     Breath sounds: Normal breath sounds.  Abdominal:     General: Abdomen is flat. Bowel sounds are normal.     Palpations: Abdomen is soft.     Tenderness: There is no abdominal tenderness. There is no guarding.  Musculoskeletal:        General: Normal range of motion.     Right wrist: No snuff box tenderness.     Left wrist: No snuff box tenderness.     Right hand: Normal.     Left hand: Normal.     Cervical back: Normal, normal range of motion and neck supple.     Thoracic back: Normal.     Lumbar back: Normal.     Right knee: No LCL laxity, MCL laxity, ACL laxity or PCL laxity.     Instability Tests: Anterior  drawer test negative. Posterior drawer test negative.     Left knee: No LCL laxity, MCL laxity, ACL laxity or PCL laxity.    Instability Tests: Anterior drawer test negative. Posterior drawer test negative.     Right ankle: Normal.     Right Achilles Tendon: Normal.     Left ankle: Normal.     Left Achilles Tendon: Normal.     Right foot: Normal.     Left foot: Normal.     Comments: No patella alta or b[place],  b patellar and quadriceps tendons intact   Skin:    General: Skin is warm and dry.     Capillary Refill: Capillary refill takes less than 2 seconds.  Neurological:     General: No focal deficit present.     Mental Status: She is alert and oriented to person, place, and time.     Deep Tendon Reflexes: Reflexes normal.  Psychiatric:        Mood and Affect: Mood normal.        Behavior: Behavior normal.    ED Results / Procedures / Treatments   Labs (all labs ordered are listed, but only abnormal results are displayed) Labs Reviewed - No data to display  EKG None  Radiology CT Head Wo Contrast  Result Date: 06/03/2021 CLINICAL DATA:  Recent fall EXAM: CT HEAD WITHOUT CONTRAST CT CERVICAL SPINE WITHOUT CONTRAST TECHNIQUE: Multidetector CT imaging of the head and cervical spine was performed following the standard protocol without intravenous contrast. Multiplanar CT image reconstructions of the cervical spine were also generated. RADIATION DOSE REDUCTION: This exam was performed according to the departmental dose-optimization program which includes automated exposure control, adjustment of the mA and/or kV according to patient size and/or use of iterative reconstruction technique. COMPARISON:  03/15/2016 FINDINGS: CT HEAD FINDINGS Brain: No evidence of acute infarction, hemorrhage, hydrocephalus, extra-axial collection or mass lesion/mass effect. Vascular: No hyperdense vessel or unexpected calcification. Skull: Normal. Negative for fracture or focal lesion. Sinuses/Orbits: No acute  finding. Other: None. CT CERVICAL SPINE FINDINGS Alignment: Within normal limits. Skull base and vertebrae: 7 cervical segments are well visualized. Vertebral body height is well maintained. Multilevel facet hypertrophic changes are noted. Osteophytic changes are seen as well. The odontoid is within normal limits. No acute fracture or acute facet abnormality is noted. 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 Soft tissues and spinal canal: Surrounding soft tissue structures are within normal limits. Upper chest: Visualized lung apices are unremarkable. Other: None IMPRESSION: CT of the head: No acute intracranial abnormality  noted. CT of the cervical spine: Multilevel degenerative change without acute abnormality. Electronically Signed   By: Inez Catalina M.D.   On: 06/03/2021 23:37   CT Cervical Spine Wo Contrast  Result Date: 06/03/2021 CLINICAL DATA:  Recent fall EXAM: CT HEAD WITHOUT CONTRAST CT CERVICAL SPINE WITHOUT CONTRAST TECHNIQUE: Multidetector CT imaging of the head and cervical spine was performed following the standard protocol without intravenous contrast. Multiplanar CT image reconstructions of the cervical spine were also generated. RADIATION DOSE REDUCTION: This exam was performed according to the departmental dose-optimization program which includes automated exposure control, adjustment of the mA and/or kV according to patient size and/or use of iterative reconstruction technique. COMPARISON:  03/15/2016 FINDINGS: CT HEAD FINDINGS Brain: No evidence of acute infarction, hemorrhage, hydrocephalus, extra-axial collection or mass lesion/mass effect. Vascular: No hyperdense vessel or unexpected calcification. Skull: Normal. Negative for fracture or focal lesion. Sinuses/Orbits: No acute finding. Other: None. CT CERVICAL SPINE FINDINGS Alignment: Within normal limits. Skull base and vertebrae: 7 cervical segments are well visualized. Vertebral body height is well maintained.  Multilevel facet hypertrophic changes are noted. Osteophytic changes are seen as well. The odontoid is within normal limits. No acute fracture or acute facet abnormality is noted. 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 Soft tissues and spinal canal: Surrounding soft tissue structures are within normal limits. Upper chest: Visualized lung apices are unremarkable. Other: None IMPRESSION: CT of the head: No acute intracranial abnormality noted. CT of the cervical spine: Multilevel degenerative change without acute abnormality. Electronically Signed   By: Inez Catalina M.D.   On: 06/03/2021 23:37   DG Knee Complete 4 Views Left  Result Date: 06/03/2021 CLINICAL DATA:  Fall, right knee pain EXAM: LEFT KNEE - COMPLETE 4+ VIEW COMPARISON:  None Available. FINDINGS: No fracture or dislocation is seen. Mild degenerative changes with narrowing of the medial and patellofemoral compartments and sharpening of the tibial spines. Visualized soft tissues are within normal limits. No suprapatellar knee joint effusion. IMPRESSION: Negative. Electronically Signed   By: Julian Hy M.D.   On: 06/03/2021 22:10   DG Knee Complete 4 Views Right  Result Date: 06/03/2021 CLINICAL DATA:  Fall, right knee pain EXAM: RIGHT KNEE - COMPLETE 4+ VIEW COMPARISON:  None Available. FINDINGS: No fracture or dislocation is seen. The joint spaces are preserved. The visualized soft tissues are unremarkable. Possible small suprapatellar knee joint effusion. IMPRESSION: Negative. Electronically Signed   By: Julian Hy M.D.   On: 06/03/2021 22:10    Procedures Procedures    Medications Ordered in ED Medications  acetaminophen (TYLENOL) tablet 1,000 mg (1,000 mg Oral Given 06/03/21 2330)  ibuprofen (ADVIL) tablet 800 mg (800 mg Oral Given 06/03/21 2330)    ED Course/ Medical Decision Making/ A&P                           Medical Decision Making Fall onto knees turned head to not strike teeth   Amount  and/or Complexity of Data Reviewed Independent Historian: spouse    Details: see above Radiology: ordered and independent interpretation performed.    Details: CT head and C spine are without acute finding.  B knees without fracture  Risk OTC drugs. Prescription drug management. Risk Details: Knee pain post fall.  20 minutes of ice therapy every 2 hours for 2 days.  Ibuprofen and tylenol.  Take ibuprofen on a full stomach.      Final Clinical Impression(s) /  ED Diagnoses Final diagnoses:  None   Return for intractable cough, coughing up blood, fevers > 100.4 unrelieved by medication, shortness of breath, intractable vomiting, chest pain, shortness of breath, weakness, numbness, changes in speech, facial asymmetry, abdominal pain, passing out, Inability to tolerate liquids or food, cough, altered mental status or any concerns. No signs of systemic illness or infection. The patient is nontoxic-appearing on exam and vital signs are within normal limits.  I have reviewed the triage vital signs and the nursing notes. Pertinent labs & imaging results that were available during my care of the patient were reviewed by me and considered in my medical decision making (see chart for details). After history, exam, and medical workup I feel the patient has been appropriately medically screened and is safe for discharge home. Pertinent diagnoses were discussed with the patient. Patient was given return precautions.      Rx / DC Orders ED Discharge Orders     None         Rodriques Badie, MD 06/04/21 0030

## 2021-06-16 ENCOUNTER — Encounter: Payer: Self-pay | Admitting: Gastroenterology

## 2021-07-08 LAB — HM MAMMOGRAPHY

## 2021-07-18 HISTORY — PX: COLONOSCOPY: SHX174

## 2021-07-20 ENCOUNTER — Ambulatory Visit (AMBULATORY_SURGERY_CENTER): Payer: Self-pay | Admitting: *Deleted

## 2021-07-20 VITALS — Ht 62.0 in | Wt 141.0 lb

## 2021-07-20 DIAGNOSIS — Z86010 Personal history of colonic polyps: Secondary | ICD-10-CM

## 2021-07-20 NOTE — Progress Notes (Signed)
No egg or soy allergy known to patient  No issues known to pt with past sedation with any surgeries or procedures Patient denies ever being told they had issues or difficulty with intubation  No FH of Malignant Hyperthermia Pt is not on diet pills Pt is not on  home 02  Pt is not on blood thinners  Pt denies issues with constipation  No A fib or A flutter Have any cardiac testing pending--pt denies

## 2021-07-31 ENCOUNTER — Encounter: Payer: Self-pay | Admitting: Gastroenterology

## 2021-08-10 ENCOUNTER — Ambulatory Visit (AMBULATORY_SURGERY_CENTER): Payer: 59 | Admitting: Gastroenterology

## 2021-08-10 ENCOUNTER — Encounter: Payer: Self-pay | Admitting: Gastroenterology

## 2021-08-10 VITALS — BP 117/75 | HR 77 | Temp 97.5°F | Resp 13 | Ht 62.0 in | Wt 141.0 lb

## 2021-08-10 DIAGNOSIS — D125 Benign neoplasm of sigmoid colon: Secondary | ICD-10-CM | POA: Diagnosis not present

## 2021-08-10 DIAGNOSIS — D120 Benign neoplasm of cecum: Secondary | ICD-10-CM

## 2021-08-10 DIAGNOSIS — Z86010 Personal history of colonic polyps: Secondary | ICD-10-CM

## 2021-08-10 DIAGNOSIS — D122 Benign neoplasm of ascending colon: Secondary | ICD-10-CM

## 2021-08-10 DIAGNOSIS — Z09 Encounter for follow-up examination after completed treatment for conditions other than malignant neoplasm: Secondary | ICD-10-CM | POA: Diagnosis not present

## 2021-08-10 MED ORDER — SODIUM CHLORIDE 0.9 % IV SOLN: 500.0000 mL | INTRAVENOUS | Status: DC

## 2021-08-10 NOTE — Progress Notes (Signed)
[facility] History and Physical   Primary Care Physician:  Marcellina Millin   Reason for Procedure:   History of colon polyps  Plan:    colonoscopy     HPI: Rachel [name] is a 71 y.o. female  here for colonoscopy surveillance - adenoma removed 01/2014. Patient denies any bowel symptoms at this time. No family history of colon cancer known. Otherwise feels well without any cardiopulmonary symptoms.    Past Medical History:  Diagnosis Date   Arthritis    knees   Bell's palsy    Cataract    removed both   Gastroesophageal reflux disease 02/17/2015   GERD (gastroesophageal reflux disease)    Herpes    Hypertension     Past Surgical History:  Procedure Laterality Date   ABDOMINAL HYSTERECTOMY     COLONOSCOPY     POLYPECTOMY      Prior to Admission medications   Medication Sig Start Date End Date Taking? Authorizing Provider  amLODipine (NORVASC) 10 MG tablet Take 1 tablet (10 mg total) by mouth daily. 04/09/21  Yes Irene Pap, PA-C  aspirin EC 81 MG tablet Take 81 mg by mouth daily. Swallow whole.   Yes [provider]  B Complex-C (SUPER B COMPLEX PO) Take by mouth.   Yes [provider]  calcium carbonate (OS-CAL) 600 MG TABS tablet Take 600 mg by mouth 2 (two) times daily with a meal.   Yes [provider]  Multiple Vitamin (MULTIVITAMIN WITH MINERALS) TABS tablet Take 1 tablet by mouth daily.   Yes [provider]  omeprazole (PRILOSEC) 20 MG capsule Take 1 capsule (20 mg total) by mouth 2 (two) times daily before a meal. 12/08/20  Yes Tysinger, Camelia Eng, PA-C  vitamin C (ASCORBIC ACID) 500 MG tablet Take 500 mg by mouth daily.   Yes [provider]  acyclovir (ZOVIRAX) 400 MG tablet Take 1 tablet (400 mg total) by mouth 2 (two) times daily. 04/07/20   Just, Laurita Quint, FNP  diclofenac Sodium (VOLTAREN) 1 % GEL Apply 2 g topically 4 (four) times daily. Patient not taking: Reported on 04/09/2021 09/24/20    Girtha Rm, NP-C    Current Outpatient Medications  Medication Sig Dispense Refill   amLODipine (NORVASC) 10 MG tablet Take 1 tablet (10 mg total) by mouth daily. 90 tablet 1   aspirin EC 81 MG tablet Take 81 mg by mouth daily. Swallow whole.     B Complex-C (SUPER B COMPLEX PO) Take by mouth.     calcium carbonate (OS-CAL) 600 MG TABS tablet Take 600 mg by mouth 2 (two) times daily with a meal.     Multiple Vitamin (MULTIVITAMIN WITH MINERALS) TABS tablet Take 1 tablet by mouth daily.     omeprazole (PRILOSEC) 20 MG capsule Take 1 capsule (20 mg total) by mouth 2 (two) times daily before a meal. 180 capsule 0   vitamin C (ASCORBIC ACID) 500 MG tablet Take 500 mg by mouth daily.     acyclovir (ZOVIRAX) 400 MG tablet Take 1 tablet (400 mg total) by mouth 2 (two) times daily. 60 tablet 0   diclofenac Sodium (VOLTAREN) 1 % GEL Apply 2 g topically 4 (four) times daily. (Patient not taking: Reported on 04/09/2021) 100 g 0   Current Facility-Administered Medications  Medication Dose Route Frequency Provider Last Rate Last Admin   0.9 %  sodium chloride infusion  500 mL Intravenous Continuous Dayvon Dax, Carlota Raspberry, MD  Allergies as of 08/10/2021   (No Known Allergies)    Family History  Problem Relation Age of Onset   Stroke Mother    Heart disease Mother    Prostate cancer Father    Heart disease Father    Cancer Father        prostate cancer   Colon cancer Maternal Grandmother        pt unsure, MGM might have had colon CA   Cancer Maternal Grandfather        ? type of cancer   Esophageal cancer Neg Hx    Stomach cancer Neg Hx    Rectal cancer Neg Hx    Colon polyps Neg Hx     Social History   Socioeconomic History   Marital status: Married    Spouse name: Not on file   Number of children: Not on file   Years of education: Not on file   Highest education level: Not on file  Occupational History   Not on file  Tobacco Use   Smoking status: Never   Smokeless  tobacco: Never  Vaping Use   Vaping Use: Never used  Substance and Sexual Activity   Alcohol use: No   Drug use: No   Sexual activity: Not on file  Other Topics Concern   Not on file  Social History Narrative   Not on file   Social Determinants of Health   Financial Resource Strain: Not on file  Food Insecurity: Not on file  Transportation Needs: Not on file  Physical Activity: Not on file  Stress: Not on file  Social Connections: Not on file  Intimate Partner Violence: Not on file    Review of Systems: All other review of systems negative except as mentioned in the HPI.  Physical Exam: Vital signs BP 132/64   Pulse 75   Temp (!) 97.5 F (36.4 C)   Ht '5\' 2"'$  (1.575 m)   Wt 141 lb (64 kg)   LMP 08/01/2012   SpO2 99%   BMI 25.79 kg/m   General:   Alert,  Well-developed, pleasant and cooperative in NAD Lungs:  Clear throughout to auscultation.   Heart:  Regular rate and rhythm Abdomen:  Soft, nontender and nondistended.   Neuro/Psych:  Alert and cooperative. Normal mood and affect. A and O x 3  Jolly Mango, MD [facility]

## 2021-08-10 NOTE — Progress Notes (Signed)
Pt non-responsive, VVS, Report to RN  °

## 2021-08-10 NOTE — Progress Notes (Signed)
Called to room to assist during endoscopic procedure.  Patient ID and intended procedure confirmed with present staff. Received instructions for my participation in the procedure from the performing physician.  

## 2021-08-10 NOTE — Op Note (Signed)
[facility] Patient Name: Rachel [name] Procedure Date: 08/10/2021 3:57 PM MRN: [id number] Endoscopist: Remo Lipps P. Havery Moros , MD Age: 71 Referring MD:  Date of Birth: 1950/07/15 Gender: Female Account #: [id number] Procedure:                Colonoscopy Indications:              High risk colon cancer surveillance: Personal                            history of colonic polyps - adenoma removed 01/2014 Medicines:                Monitored Anesthesia Care Procedure:                Pre-Anesthesia Assessment:                           - Prior to the procedure, a History and Physical                            was performed, and patient medications and                            allergies were reviewed. The patient's tolerance of                            previous anesthesia was also reviewed. The risks                            and benefits of the procedure and the sedation                            options and risks were discussed with the patient.                            All questions were answered, and informed consent                            was obtained. Prior Anticoagulants: The patient has                            taken no previous anticoagulant or antiplatelet                            agents. ASA Grade Assessment: II - A patient with                            mild systemic disease. After reviewing the risks                            and benefits, the patient was deemed in                            satisfactory condition to undergo the procedure.  After obtaining informed consent, the colonoscope                            was passed under direct vision. Throughout the                            procedure, the patient's blood pressure, pulse, and                            oxygen saturations were monitored continuously. The                            [id number]-HQ190L Colonoscope was introduced through the                             anus and advanced to the the cecum, identified by                            appendiceal orifice and ileocecal valve. The                            colonoscopy was performed without difficulty. The                            patient tolerated the procedure well. The quality                            of the bowel preparation was good. The ileocecal                            valve, appendiceal orifice, and rectum were                            photographed. Scope In: 4:08:46 PM Scope Out: 4:25:52 PM Scope Withdrawal Time: 0 hours 12 minutes 52 seconds  Total Procedure Duration: 0 hours 17 minutes 6 seconds  Findings:                 The perianal and digital rectal examinations were                            normal.                           A diminutive polyp was found in the cecum. The                            polyp was sessile. The polyp was removed with a                            cold snare. Resection and retrieval were complete.                           A diminutive polyp was found in the ascending  colon. The polyp was sessile. The polyp was removed                            with a cold snare. Resection was complete, but the                            polyp tissue was not retrieved.                           A 4 to 5 mm polyp was found in the sigmoid colon.                            The polyp was semi-sessile. The polyp was removed                            with a cold snare. Resection and retrieval were                            complete.                           Internal hemorrhoids were found during                            retroflexion. The hemorrhoids were small.                           The exam was otherwise without abnormality. Complications:            No immediate complications. Estimated blood loss:                            Minimal. Estimated Blood Loss:     Estimated blood loss was minimal. Impression:               - One  diminutive polyp in the cecum, removed with a                            cold snare. Resected and retrieved.                           - One diminutive polyp in the ascending colon,                            removed with a cold snare. Complete resection.                            Polyp tissue not retrieved.                           - One 4 to 5 mm polyp in the sigmoid colon, removed                            with a cold snare. Resected and retrieved.                           -  Internal hemorrhoids.                           - The examination was otherwise normal. Recommendation:           - Patient has a contact number available for                            emergencies. The signs and symptoms of potential                            delayed complications were discussed with the                            patient. Return to normal activities tomorrow.                            Written discharge instructions were provided to the                            patient.                           - Resume previous diet.                           - Continue present medications.                           - Await pathology results. Remo Lipps P. Purvi Ruehl, MD 08/10/2021 4:30:26 PM This report has been signed electronically.

## 2021-08-10 NOTE — Patient Instructions (Signed)
Handout on polyps provided   Await pathology results.   Continue current medications.   YOU HAD AN ENDOSCOPIC PROCEDURE TODAY AT [facility]:   Refer to the procedure report that was given to you for any specific questions about what was found during the examination.  If the procedure report does not answer your questions, please call your gastroenterologist to clarify.  If you requested that your care partner not be given the details of your procedure findings, then the procedure report has been included in a sealed envelope for you to review at your convenience later.  YOU SHOULD EXPECT: Some feelings of bloating in the abdomen. Passage of more gas than usual.  Walking can help get rid of the air that was put into your GI tract during the procedure and reduce the bloating. If you had a lower endoscopy (such as a colonoscopy or flexible sigmoidoscopy) you may notice spotting of blood in your stool or on the toilet paper. If you underwent a bowel prep for your procedure, you may not have a normal bowel movement for a few days.  Please Note:  You might notice some irritation and congestion in your nose or some drainage.  This is from the oxygen used during your procedure.  There is no need for concern and it should clear up in a day or so.  SYMPTOMS TO REPORT IMMEDIATELY:  Following lower endoscopy (colonoscopy or flexible sigmoidoscopy):  Excessive amounts of blood in the stool  Significant tenderness or worsening of abdominal pains  Swelling of the abdomen that is new, acute  Fever of 100F or higher  For urgent or emergent issues, a gastroenterologist can be reached at any hour by calling [phone]. Do not use MyChart messaging for urgent concerns.    DIET:  We do recommend a small meal at first, but then you may proceed to your regular diet.  Drink plenty of fluids but you should avoid alcoholic beverages for 24 hours.  ACTIVITY:  You should plan to take it easy for  the rest of today and you should NOT DRIVE or use heavy machinery until tomorrow (because of the sedation medicines used during the test).    FOLLOW UP: Our staff will call the number listed on your records the next business day following your procedure.  We will call around 7:15- 8:00 am to check on you and address any questions or concerns that you may have regarding the information given to you following your procedure. If we do not reach you, we will leave a message.  If you develop any symptoms (ie: fever, flu-like symptoms, shortness of breath, cough etc.) before then, please call [phone].  If you test positive for Covid 19 in the 2 weeks post procedure, please call and report this information to [place].    If any biopsies were taken you will be contacted by phone or by letter within the next 1-3 weeks.  Please call us at [phone] if you have not heard about the biopsies in 3 weeks.    SIGNATURES/CONFIDENTIALITY: You and/or your care partner have signed paperwork which will be entered into your electronic medical record.  These signatures attest to the fact that that the information above on your After Visit Summary has been reviewed and is understood.  Full responsibility of the confidentiality of this discharge information lies with you and/or your care-partner.

## 2021-08-11 ENCOUNTER — Telehealth: Payer: Self-pay

## 2021-08-11 NOTE — Telephone Encounter (Signed)
  Follow up Call-     08/10/2021    2:35 PM  Call back number  Post procedure Call Back phone  # [phone]  Permission to leave phone message Yes     Patient questions:  Do you have a fever, pain , or abdominal swelling? No. Pain Score  0 *  Have you tolerated food without any problems? Yes.    Have you been able to return to your normal activities? Yes.    Do you have any questions about your discharge instructions: Diet   No. Medications  No. Follow up visit  No.  Do you have questions or concerns about your Care? No.  Actions: * If pain score is 4 or above: No action needed, pain <4.

## 2021-08-13 ENCOUNTER — Encounter: Payer: Self-pay | Admitting: Gastroenterology

## 2021-10-15 ENCOUNTER — Ambulatory Visit: Payer: 59 | Admitting: Physician Assistant

## 2021-10-30 ENCOUNTER — Other Ambulatory Visit: Payer: Self-pay | Admitting: Physician Assistant

## 2021-11-04 ENCOUNTER — Encounter: Payer: 59 | Admitting: Medical

## 2021-11-04 ENCOUNTER — Encounter: Payer: Self-pay | Admitting: Medical

## 2021-11-04 ENCOUNTER — Ambulatory Visit: Payer: 59 | Admitting: Medical

## 2021-11-04 VITALS — BP 110/70 | HR 56 | Ht 61.5 in | Wt 144.4 lb

## 2021-11-04 DIAGNOSIS — Z0000 Encounter for general adult medical examination without abnormal findings: Secondary | ICD-10-CM

## 2021-11-04 DIAGNOSIS — R7303 Prediabetes: Secondary | ICD-10-CM

## 2021-11-04 DIAGNOSIS — I10 Essential (primary) hypertension: Secondary | ICD-10-CM

## 2021-11-04 DIAGNOSIS — J301 Allergic rhinitis due to pollen: Secondary | ICD-10-CM

## 2021-11-04 DIAGNOSIS — Z7185 Encounter for immunization safety counseling: Secondary | ICD-10-CM | POA: Insufficient documentation

## 2021-11-04 DIAGNOSIS — R519 Headache, unspecified: Secondary | ICD-10-CM | POA: Insufficient documentation

## 2021-11-04 DIAGNOSIS — Z13220 Encounter for screening for lipoid disorders: Secondary | ICD-10-CM

## 2021-11-04 DIAGNOSIS — Z1389 Encounter for screening for other disorder: Secondary | ICD-10-CM

## 2021-11-04 DIAGNOSIS — K219 Gastro-esophageal reflux disease without esophagitis: Secondary | ICD-10-CM | POA: Diagnosis not present

## 2021-11-04 DIAGNOSIS — A6000 Herpesviral infection of urogenital system, unspecified: Secondary | ICD-10-CM

## 2021-11-04 DIAGNOSIS — Z2821 Immunization not carried out because of patient refusal: Secondary | ICD-10-CM

## 2021-11-04 DIAGNOSIS — Z1329 Encounter for screening for other suspected endocrine disorder: Secondary | ICD-10-CM

## 2021-11-04 LAB — POCT URINALYSIS DIP (PROADVANTAGE DEVICE)
Bilirubin, UA: NEGATIVE
Blood, UA: NEGATIVE
Glucose, UA: NEGATIVE mg/dL
Ketones, POC UA: NEGATIVE mg/dL
Leukocytes, UA: NEGATIVE
Nitrite, UA: NEGATIVE
Protein Ur, POC: NEGATIVE mg/dL
Specific Gravity, Urine: 1.015
Urobilinogen, Ur: NEGATIVE
pH, UA: 7 (ref 5.0–8.0)

## 2021-11-04 MED ORDER — ACYCLOVIR 400 MG PO TABS: 400.0000 mg | ORAL_TABLET | Freq: Two times a day (BID) | ORAL | 2 refills | Status: DC

## 2021-11-04 NOTE — Patient Instructions (Signed)
This visit was a preventative care visit, also known as wellness visit or routine physical.   Topics typically include healthy lifestyle, diet, exercise, preventative care, vaccinations, sick and well care, proper use of emergency dept and after hours care, as well as other concerns.     Recommendations: Continue to return yearly for your annual wellness and preventative care visits.  This gives [place] a chance to discuss healthy lifestyle, exercise, vaccinations, review your chart record, and perform screenings where appropriate.  I recommend you see your eye doctor yearly for routine vision care.  I recommend you see your dentist yearly for routine dental care including hygiene visits twice yearly.   Vaccination recommendations were reviewed Immunization History  Administered Date(s) Administered   PFIZER(Purple Top)SARS-COV-2 Vaccination 06/03/2019, 07/04/2019   Pfizer Covid-19 Vaccine Bivalent Booster 33yr & up 01/15/2021   Pneumococcal Conjugate-13 03/08/2018    It is recommended to have the following vaccines.  You declined all vaccines today.  Tdap tetanus diptheria pertussis vaccine Pneumococcal Prevnar 20 vaccine Yearly flu shot RSV virus vaccine Shingrix shingles vaccine   Screening for cancer: Colon cancer screening: I reviewed your colonoscopy on file that is up to date from 07/2021 Repeat in 5 years  Breast cancer screening: You should perform a self breast exam monthly.   We reviewed recommendations for regular mammograms and breast cancer screening.   Skin cancer screening: Check your skin regularly for new changes, growing lesions, or other lesions of concern Come in for evaluation if you have skin lesions of concern.  Lung cancer screening: If you have a greater than 20 pack year history of tobacco use, then you may qualify for lung cancer screening with a chest CT scan.   Please call your insurance company to inquire about coverage for this test.  We  currently don't have screenings for other cancers besides breast, cervical, colon, and lung cancers.  If you have a strong family history of cancer or have other cancer screening concerns, please let me know.    Bone health: Get at least 150 minutes of aerobic exercise weekly Get weight bearing exercise at least once weekly Bone density test:  A bone density test is an imaging test that uses a type of X-ray to measure the amount of calcium and other minerals in your bones. The test may be used to diagnose or screen you for a condition that causes weak or thin bones (osteoporosis), predict your risk for a broken bone (fracture), or determine how well your osteoporosis treatment is working. The bone density test is recommended for females 637and older, or females or males <<16if certain risk factors such as thyroid disease, long term use of steroids such as for asthma or rheumatological issues, vitamin D deficiency, estrogen deficiency, family history of osteoporosis, self or family history of fragility fracture in first degree relative.  We will request most recent bone density test   Heart health: Get at least 150 minutes of aerobic exercise weekly Limit alcohol It is important to maintain a healthy blood pressure and healthy cholesterol numbers  Heart disease screening: Screening for heart disease includes screening for blood pressure, fasting lipids, glucose/diabetes screening, BMI height to weight ratio, reviewed of smoking status, physical activity, and diet.    Goals include blood pressure 120/80 or less, maintaining a healthy lipid/cholesterol profile, preventing diabetes or keeping diabetes numbers under good control, not smoking or using tobacco products, exercising most days per week or at least 150 minutes per week of  exercise, and eating healthy variety of fruits and vegetables, healthy oils, and avoiding unhealthy food choices like fried food, fast food, high sugar and high  cholesterol foods.    Other tests may possibly include EKG test, CT coronary calcium score, echocardiogram, exercise treadmill stress test.   Consider CT coronary heart disease screening test    Medical care options: I recommend you continue to seek care here first for routine care.  We try really hard to have available appointments Monday through Friday daytime hours for sick visits, acute visits, and physicals.  Urgent care should be used for after hours and weekends for significant issues that cannot wait till the next day.  The emergency department should be used for significant potentially life-threatening emergencies.  The emergency department is expensive, can often have long wait times for less significant concerns, so try to utilize primary care, urgent care, or telemedicine when possible to avoid unnecessary trips to the emergency department.  Virtual visits and telemedicine have been introduced since the pandemic started in 2020, and can be convenient ways to receive medical care.  We offer virtual appointments as well to assist you in a variety of options to seek medical care.   Advanced Directives: I recommend you consider completing a [place] and Living Will.   These documents respect your wishes and help alleviate burdens on your loved ones if you were to become terminally ill or be in a position to need those documents enforced.    You can complete Advanced Directives yourself, have them notarized, then have copies made for our office, for you and for anybody you feel should have them in safe keeping.  Or, you can have an attorney prepare these documents.   If you haven't updated your Last Will and Testament in a while, it may be worthwhile having an attorney prepare these documents together and save on some costs.       Separate significant issues discussed: History of herpes-continue acyclovir as needed for flareup  Hypertension-continue current  medication  Other screening labs as below today

## 2021-11-04 NOTE — Progress Notes (Signed)
Subjective:   HPI  Rachel [name] is a 71 y.o. female who presents for Chief Complaint  Patient presents with   fasting cpe    Fasitng cpe, HAs 2-3 times a week. Sees obgyn     Patient Care Team: Marcellina Millin as PCP - General (Physician Assistant) Sees dentist Sees eye doctor Dr. Cozad Cellar, GI  Concerns: Having some headaches in recent months.  Having headaches about 3 times per week.  They tend to be left-sided.  Throbbing.  No numbness tingling or weakness.  It feels similar to headaches she was having when she had Bell's palsy in the past.  She does not sleep very much hours because she feels hyped up when she gets off work.  She often will binge watch TV into the early morning hours.  Doing okay except for some shoulder pain and left rib pain.  She fell at work recently has been seeing therapy  Still works full-time  Hypertension-compliant with medication  Feels like she has a hair in her left eye  History of herpes, uses acyclovir as needed     11/04/2021    9:59 AM 04/09/2021   10:41 AM 05/13/2020   10:32 AM 04/07/2020    8:11 AM 06/29/2019    9:22 AM  Depression screen PHQ 2/9  Decreased Interest 0 0 0 0 0  Down, Depressed, Hopeless 0 0 0 0 0  PHQ - 2 Score 0 0 0 0 0    Reviewed their medical, surgical, family, social, medication, and allergy history and updated chart as appropriate.   Review of Systems Constitutional: -fever, -chills, -sweats, -unexpected weight change, -decreased appetite, -fatigue Allergy: -sneezing, -itching, -congestion Dermatology: -changing moles, --rash, -lumps ENT: -runny nose, -ear pain, -sore throat, -hoarseness, -sinus pain, -teeth pain, - ringing in ears, -hearing loss, -nosebleeds Cardiology: -chest pain, -palpitations, -swelling, -difficulty breathing when lying flat, -waking up short of breath Respiratory: -cough, -shortness of breath, -difficulty breathing with exercise or exertion, -wheezing, -coughing  up blood Gastroenterology: -abdominal pain, -nausea, -vomiting, -diarrhea, -constipation, -blood in stool, -changes in bowel movement, -difficulty swallowing or eating Hematology: -bleeding, -bruising  Musculoskeletal: +joint aches, -muscle aches, -joint swelling, -back pain, -neck pain, -cramping, -changes in gait Ophthalmology: denies vision changes, eye redness, itching, discharge Urology: -burning with urination, -difficulty urinating, -blood in urine, -urinary frequency, -urgency, -incontinence Neurology: +headache, -weakness, -tingling, -numbness, -memory loss, -falls, -dizziness Psychology: -depressed mood, -agitation, -sleep problems Breast/gyn: -breast tendnerss, -discharge, -lumps, -vaginal discharge,- irregular periods, -heavy periods      11/04/2021    9:59 AM 04/09/2021   10:41 AM 05/13/2020   10:32 AM 04/07/2020    8:11 AM 06/29/2019    9:22 AM  Depression screen PHQ 2/9  Decreased Interest 0 0 0 0 0  Down, Depressed, Hopeless 0 0 0 0 0  PHQ - 2 Score 0 0 0 0 0       Objective:  BP 110/70   Pulse (!) 56   Ht 5' 1.5" (1.562 m)   Wt 144 lb 6.4 oz (65.5 kg)   LMP 08/01/2012   BMI 26.84 kg/m   Wt Readings from Last 3 Encounters:  11/04/21 144 lb 6.4 oz (65.5 kg)  11/04/21 144 lb 6.4 oz (65.5 kg)  08/10/21 141 lb (64 kg)   General appearance: alert, no distress, WD/WN, African American female Skin: unremarkable HEENT: normocephalic, conjunctiva/corneas normal, sclerae anicteric, PERRLA, EOMi, nares patent, no discharge or erythema, pharynx normal Oral cavity: MMM, tongue normal, teeth  in goood repair Neck: supple, no lymphadenopathy, no thyromegaly, no masses, normal ROM, no bruits Chest: mild tenderness left lower lateral ribs, otherwise non tender, normal shape and expansion Heart: RRR, normal S1, S2, no murmurs Lungs: CTA bilaterally, no wheezes, rhonchi, or rales Abdomen: +bs, soft, non tender, non distended, no masses, no hepatomegaly, no splenomegaly, no  bruits Back: non tender, normal ROM, no scoliosis Musculoskeletal: tender right shoulder in general, otherwise upper extremities non tender, no obvious deformity, normal ROM throughout, lower extremities non tender, no obvious deformity, normal ROM throughout Extremities: no edema, no cyanosis, no clubbing Pulses: 2+ symmetric, upper and lower extremities, normal cap refill Neurological: alert, oriented x 3, CN2-12 intact, strength normal upper extremities and lower extremities, sensation normal throughout, DTRs 2+ throughout, no cerebellar signs, gait normal Psychiatric: normal affect, behavior normal, pleasant  Gyn/breast - declined    Assessment and Plan :   Encounter Diagnoses  Name Primary?   Encounter for health maintenance examination in adult Yes   Seasonal allergic rhinitis due to pollen    Prediabetes    GERD without esophagitis    Genital herpes simplex, unspecified site    Essential hypertension    Screening for hematuria or proteinuria    Vaccine counseling    Influenza vaccination declined    Screening for lipid disorders    Screening for thyroid disorder    Nonintractable headache, unspecified chronicity pattern, unspecified headache type      This visit was a preventative care visit, also known as wellness visit or routine physical.   Topics typically include healthy lifestyle, diet, exercise, preventative care, vaccinations, sick and well care, proper use of emergency dept and after hours care, as well as other concerns.     Recommendations: Continue to return yearly for your annual wellness and preventative care visits.  This gives [place] a chance to discuss healthy lifestyle, exercise, vaccinations, review your chart record, and perform screenings where appropriate.  I recommend you see your eye doctor yearly for routine vision care.  I recommend you see your dentist yearly for routine dental care including hygiene visits twice yearly.   Vaccination  recommendations were reviewed Immunization History  Administered Date(s) Administered   PFIZER(Purple Top)SARS-COV-2 Vaccination 06/03/2019, 07/04/2019   Pfizer Covid-19 Vaccine Bivalent Booster 82yr & up 01/15/2021   Pneumococcal Conjugate-13 03/08/2018    It is recommended to have the following vaccines.  You declined all vaccines today.  Tdap tetanus diptheria pertussis vaccine Pneumococcal Prevnar 20 vaccine Yearly flu shot RSV virus vaccine Shingrix shingles vaccine   Screening for cancer: Colon cancer screening: I reviewed your colonoscopy on file that is up to date from 07/2021 Repeat in 5 years  Breast cancer screening: You should perform a self breast exam monthly.   We reviewed recommendations for regular mammograms and breast cancer screening.   Skin cancer screening: Check your skin regularly for new changes, growing lesions, or other lesions of concern Come in for evaluation if you have skin lesions of concern.  Lung cancer screening: If you have a greater than 20 pack year history of tobacco use, then you may qualify for lung cancer screening with a chest CT scan.   Please call your insurance company to inquire about coverage for this test.  We currently don't have screenings for other cancers besides breast, cervical, colon, and lung cancers.  If you have a strong family history of cancer or have other cancer screening concerns, please let me know.    Bone health: Get  at least 150 minutes of aerobic exercise weekly Get weight bearing exercise at least once weekly Bone density test:  A bone density test is an imaging test that uses a type of X-ray to measure the amount of calcium and other minerals in your bones. The test may be used to diagnose or screen you for a condition that causes weak or thin bones (osteoporosis), predict your risk for a broken bone (fracture), or determine how well your osteoporosis treatment is working. The bone density test is  recommended for females 34 and older, or females or males <37 if certain risk factors such as thyroid disease, long term use of steroids such as for asthma or rheumatological issues, vitamin D deficiency, estrogen deficiency, family history of osteoporosis, self or family history of fragility fracture in first degree relative.  We will request most recent bone density test   Heart health: Get at least 150 minutes of aerobic exercise weekly Limit alcohol It is important to maintain a healthy blood pressure and healthy cholesterol numbers  Heart disease screening: Screening for heart disease includes screening for blood pressure, fasting lipids, glucose/diabetes screening, BMI height to weight ratio, reviewed of smoking status, physical activity, and diet.    Goals include blood pressure 120/80 or less, maintaining a healthy lipid/cholesterol profile, preventing diabetes or keeping diabetes numbers under good control, not smoking or using tobacco products, exercising most days per week or at least 150 minutes per week of exercise, and eating healthy variety of fruits and vegetables, healthy oils, and avoiding unhealthy food choices like fried food, fast food, high sugar and high cholesterol foods.    Other tests may possibly include EKG test, CT coronary calcium score, echocardiogram, exercise treadmill stress test.   Consider CT coronary heart disease screening test    Medical care options: I recommend you continue to seek care here first for routine care.  We try really hard to have available appointments Monday through Friday daytime hours for sick visits, acute visits, and physicals.  Urgent care should be used for after hours and weekends for significant issues that cannot wait till the next day.  The emergency department should be used for significant potentially life-threatening emergencies.  The emergency department is expensive, can often have long wait times for less significant  concerns, so try to utilize primary care, urgent care, or telemedicine when possible to avoid unnecessary trips to the emergency department.  Virtual visits and telemedicine have been introduced since the pandemic started in 2020, and can be convenient ways to receive medical care.  We offer virtual appointments as well to assist you in a variety of options to seek medical care.   Advanced Directives: I recommend you consider completing a [place] and Living Will.   These documents respect your wishes and help alleviate burdens on your loved ones if you were to become terminally ill or be in a position to need those documents enforced.    You can complete Advanced Directives yourself, have them notarized, then have copies made for our office, for you and for anybody you feel should have them in safe keeping.  Or, you can have an attorney prepare these documents.   If you haven't updated your Last Will and Testament in a while, it may be worthwhile having an attorney prepare these documents together and save on some costs.       Separate significant issues discussed: History of herpes-continue acyclovir as needed for flareup  Hypertension-continue current medication  Other screening labs as below today  I flushed out her eye on the left.  No obvious foreign body seen.  Used saline.  Tolerated well.  New onset headaches over the last few months.  I suspect this could in part be due to not great sleep hygiene.  We discussed sleep hygiene.  Labs today.  Consider head scan given new onset in general.  Kimori was seen today for fasting cpe.  Diagnoses and all orders for this visit:  Encounter for health maintenance examination in adult -     POCT Urinalysis DIP (Proadvantage Device) -     Comprehensive metabolic panel -     CBC with Differential/Platelet -     Lipid panel -     Hemoglobin A1c -     VITAMIN D 25 Hydroxy (Vit-D Deficiency, Fractures) -      TSH  Seasonal allergic rhinitis due to pollen  Prediabetes  GERD without esophagitis  Genital herpes simplex, unspecified site -     acyclovir (ZOVIRAX) 400 MG tablet; Take 1 tablet (400 mg total) by mouth 2 (two) times daily.  Essential hypertension  Screening for hematuria or proteinuria  Vaccine counseling  Influenza vaccination declined  Screening for lipid disorders  Screening for thyroid disorder -     TSH  Nonintractable headache, unspecified chronicity pattern, unspecified headache type     Follow-up pending labs, yearly for physical

## 2021-11-05 ENCOUNTER — Other Ambulatory Visit: Payer: Self-pay | Admitting: Medical

## 2021-11-05 LAB — CBC WITH DIFFERENTIAL/PLATELET
Basophils Absolute: 0.1 10*3/uL (ref 0.0–0.2)
Basos: 1 %
EOS (ABSOLUTE): 0.1 10*3/uL (ref 0.0–0.4)
Eos: 2 %
Hematocrit: 40.5 % (ref 34.0–46.6)
Hemoglobin: 12.7 g/dL (ref 11.1–15.9)
Immature Grans (Abs): 0 10*3/uL (ref 0.0–0.1)
Immature Granulocytes: 0 %
Lymphocytes Absolute: 2 10*3/uL (ref 0.7–3.1)
Lymphs: 34 %
MCH: 27.4 pg (ref 26.6–33.0)
MCHC: 31.4 g/dL — ABNORMAL LOW (ref 31.5–35.7)
MCV: 88 fL (ref 79–97)
Monocytes Absolute: 0.6 10*3/uL (ref 0.1–0.9)
Monocytes: 10 %
Neutrophils Absolute: 3.2 10*3/uL (ref 1.4–7.0)
Neutrophils: 53 %
Platelets: 201 10*3/uL (ref 150–450)
RBC: 4.63 x10E6/uL (ref 3.77–5.28)
RDW: 12.9 % (ref 11.7–15.4)
WBC: 6 10*3/uL (ref 3.4–10.8)

## 2021-11-05 LAB — COMPREHENSIVE METABOLIC PANEL
ALT: 13 IU/L (ref 0–32)
AST: 18 IU/L (ref 0–40)
Albumin/Globulin Ratio: 1.9 (ref 1.2–2.2)
Albumin: 4.9 g/dL — ABNORMAL HIGH (ref 3.8–4.8)
Alkaline Phosphatase: 94 IU/L (ref 44–121)
BUN/Creatinine Ratio: 16 (ref 12–28)
BUN: 11 mg/dL (ref 8–27)
Bilirubin Total: 0.6 mg/dL (ref 0.0–1.2)
CO2: 27 mmol/L (ref 20–29)
Calcium: 10.2 mg/dL (ref 8.7–10.3)
Chloride: 99 mmol/L (ref 96–106)
Creatinine, Ser: 0.68 mg/dL (ref 0.57–1.00)
Globulin, Total: 2.6 g/dL (ref 1.5–4.5)
Glucose: 95 mg/dL (ref 70–99)
Potassium: 4 mmol/L (ref 3.5–5.2)
Sodium: 141 mmol/L (ref 134–144)
Total Protein: 7.5 g/dL (ref 6.0–8.5)
eGFR: 93 mL/min/{1.73_m2} (ref >59)

## 2021-11-05 LAB — TSH: TSH: 1.38 u[IU]/mL (ref 0.450–4.500)

## 2021-11-05 LAB — LIPID PANEL
Chol/HDL Ratio: 1.6 ratio (ref 0.0–4.4)
Cholesterol, Total: 171 mg/dL (ref 100–199)
HDL: 109 mg/dL (ref >39)
LDL Chol Calc (NIH): 54 mg/dL (ref 0–99)
Triglycerides: 31 mg/dL (ref 0–149)
VLDL Cholesterol Cal: 8 mg/dL (ref 5–40)

## 2021-11-05 LAB — VITAMIN D 25 HYDROXY (VIT D DEFICIENCY, FRACTURES): Vit D, 25-Hydroxy: 29.6 ng/mL — ABNORMAL LOW (ref 30.0–100.0)

## 2021-11-05 LAB — HEMOGLOBIN A1C
Est. average glucose Bld gHb Est-mCnc: 134 mg/dL
Hgb A1c MFr Bld: 6.3 % — ABNORMAL HIGH (ref 4.8–5.6)

## 2021-11-05 MED ORDER — MELATONIN 10 MG PO TABS: 1.0000 | ORAL_TABLET | Freq: Every day | ORAL | 0 refills | Status: DC

## 2021-11-05 MED ORDER — VITAMIN D 50 MCG (2000 UT) PO CAPS: 1.0000 | ORAL_CAPSULE | Freq: Every day | ORAL | 3 refills | Status: DC

## 2021-11-09 ENCOUNTER — Other Ambulatory Visit: Payer: Self-pay | Admitting: Internal Medicine

## 2021-12-23 ENCOUNTER — Ambulatory Visit (INDEPENDENT_AMBULATORY_CARE_PROVIDER_SITE_OTHER): Payer: 59 | Admitting: Medical

## 2021-12-23 ENCOUNTER — Encounter: Payer: Self-pay | Admitting: Medical

## 2021-12-23 ENCOUNTER — Ambulatory Visit
Admission: RE | Admit: 2021-12-23 | Discharge: 2021-12-23 | Disposition: A | Discharge status: Home / Self Care | Payer: 59 | Source: Ambulatory Visit | Attending: Medical | Admitting: Medical

## 2021-12-23 VITALS — BP 126/84 | HR 70 | Temp 98.3°F | Wt 142.6 lb

## 2021-12-23 DIAGNOSIS — G8929 Other chronic pain: Secondary | ICD-10-CM

## 2021-12-23 DIAGNOSIS — R0789 Other chest pain: Secondary | ICD-10-CM | POA: Diagnosis not present

## 2021-12-23 DIAGNOSIS — M25511 Pain in right shoulder: Secondary | ICD-10-CM

## 2021-12-23 NOTE — Patient Instructions (Signed)
Please go to [facility] for your chest and rib xray.   Their hours are 8am - 4:30 pm Monday - Friday.  Take your insurance card with you.  [facility] [phone]  [address] [place], [place] [address]

## 2021-12-23 NOTE — Progress Notes (Signed)
Subjective:  Rachel [name] is a 71 y.o. 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     She notes pain under left ribs, comes and goes.   No fall or trauma.  She lifts items on the job, lifts bales of paper.  This is repetitive throughout the day.  No pain with breathing but sometimes hurts lying down at night.  No bruising or redness.  Has some pains in right shoulder as well.  Has seen specialist in the past for shoulder pain, saw physical therapist as well.   No numbness or tingling in arms.  Does get some pains in her hands.  Is right handed.   Still works 40 hours per week.  No fever, chills, night sweats, no breast pain or lumps.  She notes mammogram earlier this year.   No other aggravating or relieving factors.    No other c/o.  Past Medical History:  Diagnosis Date   Arthritis    knees   Bell's palsy    Cataract    removed both   Gastroesophageal reflux disease 02/17/2015   Herpes    Hypertension    Current Outpatient Medications on File Prior to Visit  Medication Sig Dispense Refill   amLODipine (NORVASC) 10 MG tablet TAKE 1 TABLET BY MOUTH EVERY DAY 90 tablet 1   aspirin EC 81 MG tablet Take 81 mg by mouth daily. Swallow whole.     B Complex-C (SUPER B COMPLEX PO) Take by mouth.     Cholecalciferol (VITAMIN D) 50 MCG (2000 UT) CAPS Take 1 capsule (2,000 Units total) by mouth daily. 90 capsule 3   cycloSPORINE (RESTASIS) 0.05 % ophthalmic emulsion 1 drop 2 (two) times daily.     Glucosamine-Chondroit-Vit C-Mn (GLUCOSAMINE CHONDR 500 COMPLEX PO) Take by mouth.     loteprednol (LOTEMAX) 0.5 % ophthalmic suspension Place 1 drop into both eyes 4 (four) times daily.     Melatonin 10 MG TABS Take 1 tablet by mouth at bedtime. 30 tablet 0   omeprazole (PRILOSEC) 20 MG capsule Take 1 capsule (20 mg total) by mouth 2 (two) times daily before a meal. 180  capsule 0   acyclovir (ZOVIRAX) 400 MG tablet Take 1 tablet (400 mg total) by mouth 2 (two) times daily. (Patient not taking: Reported on 12/23/2021) 60 tablet 2   No current facility-administered medications on file prior to visit.    The following portions of the patient's history were reviewed and updated as appropriate: allergies, current medications, past family history, past medical history, past social history, past surgical history and problem list.  ROS Otherwise as in subjective above     Objective: BP 126/84   Pulse 70   Temp 98.3 F (36.8 C)   Wt 142 lb 9.6 oz (64.7 kg)   LMP 08/01/2012   BMI 26.51 kg/m   General appearance: alert, no distress, well developed, well nourished No specific tenderness of chest wall but she notes pain in left lateral lower chest wall.  Otherwise chest/torso nontender without deformity Breast: no obvious mass, skin changes or lymphadenopathy MSK: right shoulder with mild decreased internal and external ROM, mild pain with ROM in general, pain with apprehension test and resisted flexion.   otherwise arm unremarkable with normal ROM Arms neurovascularly intact No edema   Exam chaperoned by nurse/CMA  Assessment: Encounter Diagnoses  Name Primary?   Chest wall pain Yes   Chronic right shoulder pain      Plan: Chest wall pain - will get update xray.   She had xrays for similar a year ago.  Consider ortho referral  Chronic right shoulder pain - xray 2022 showed arthritis and degenerative changes.  Advised referral to ortho but she declines.   Continue the topical cream she is using that seems to give her benefit, work on stretching regularly, Tylenol OTC prn.  Rachel [name] was seen today for ther.  Diagnoses and all orders for this visit:  Chest wall pain -     DG Ribs Bilateral W/Chest; Future  Chronic right shoulder pain    Follow up: pending imaging

## 2021-12-25 NOTE — Progress Notes (Signed)
Your rib and chest x-ray does not show any specific bony abnormality.  The chest x-ray is otherwise normal.  Your symptoms may be more related to muscle inflammation since you are working doing physical activity on the job throughout the day.  Make sure you are drinking plenty of water throughout the day.  You can use muscle rub topically over-the-counter such as IcyHot or you can use Tylenol over-the-counter for pain  It may be worth a trial of physical therapy since this is been an ongoing issue.  If agreeable I will put in a referral

## 2021-12-30 ENCOUNTER — Telehealth: Payer: Self-pay | Admitting: Medical

## 2021-12-30 NOTE — Telephone Encounter (Signed)
Received requested info from [facility]

## 2022-01-21 ENCOUNTER — Encounter: Payer: Self-pay | Admitting: Internal Medicine

## 2022-01-21 ENCOUNTER — Encounter: Payer: Self-pay | Admitting: Family Medicine

## 2022-01-21 ENCOUNTER — Telehealth (INDEPENDENT_AMBULATORY_CARE_PROVIDER_SITE_OTHER): Payer: 59 | Admitting: Family Medicine

## 2022-01-21 VITALS — Temp 96.3°F | Wt 141.0 lb

## 2022-01-21 DIAGNOSIS — J111 Influenza due to unidentified influenza virus with other respiratory manifestations: Secondary | ICD-10-CM | POA: Diagnosis not present

## 2022-01-21 MED ORDER — OSELTAMIVIR PHOSPHATE 75 MG PO CAPS: 75.0000 mg | ORAL_CAPSULE | Freq: Two times a day (BID) | ORAL | 0 refills | Status: DC

## 2022-01-21 NOTE — Progress Notes (Signed)
   Subjective:    Patient ID: Rachel [name], female    DOB: 1950/10/28, 72 y.o.   MRN: [id number]  HPI Documentation for virtual audio and video telecommunications through [place] encounter: The patient was located at home. 2 patient identifiers used.  The provider was located in the office. The patient did consent to this visit and is aware of possible charges through their insurance for this visit. The other persons participating in this telemedicine service were none. Time spent on call was 5 minutes and in review of previous records >20  minutes total for counseling and coordination of care. This virtual service is not related to other E/M service within previous 7 days She states that she developed myalgias, fatigue, headache and slight sore throat yesterday.  This has continued as well as worsening of the fatigue.  She did a COVID test which was negative. Review of Systems     Objective:   Physical Exam Alert and slightly toxic looking.       Assessment & Plan:  Influenza - Plan: oseltamivir (TAMIFLU) 75 MG capsule Her symptoms are probably consistent with influenza and I will go ahead and treat her.  Recommend Tylenol for aches and pains, use the Tamiflu.  I will give him a note for out of work until Monday.  She will call if she has further troubles.

## 2022-01-25 ENCOUNTER — Encounter: Payer: Self-pay | Admitting: Medical

## 2022-04-30 ENCOUNTER — Other Ambulatory Visit: Payer: Self-pay | Admitting: Medical

## 2022-08-19 ENCOUNTER — Encounter: Payer: Self-pay | Admitting: Medical

## 2022-08-19 ENCOUNTER — Ambulatory Visit (INDEPENDENT_AMBULATORY_CARE_PROVIDER_SITE_OTHER): Payer: 59 | Admitting: Medical

## 2022-08-19 VITALS — Wt 145.0 lb

## 2022-08-19 DIAGNOSIS — U071 COVID-19: Secondary | ICD-10-CM

## 2022-08-19 MED ORDER — PROMETHAZINE-DM 6.25-15 MG/5ML PO SYRP: 5.0000 mL | ORAL_SOLUTION | Freq: Four times a day (QID) | ORAL | 0 refills | Status: DC | PRN

## 2022-08-19 MED ORDER — NIRMATRELVIR/RITONAVIR (PAXLOVID)TABLET: 3.0000 | ORAL_TABLET | Freq: Two times a day (BID) | ORAL | 0 refills | Status: AC

## 2022-08-19 NOTE — Progress Notes (Signed)
Subjective:     Patient ID: Rachel [name], female   DOB: April 06, 1950, 72 y.o.   MRN: [id number]  This visit type was conducted due to national recommendations for restrictions regarding the COVID-19 Pandemic (e.g. social distancing) in an effort to limit this patient's exposure and mitigate transmission in our community.  Due to their co-morbid illnesses, this patient is at least at moderate risk for complications without adequate follow up.  This format is felt to be most appropriate for this patient at this time.    Documentation for virtual audio and video telecommunications through [place] encounter:  The patient was located at home. The provider was located in the office. The patient did consent to this visit and is aware of possible charges through their insurance for this visit.  The other persons participating in this telemedicine service were none. Time spent on call was 20 minutes and in review of previous records 20 minutes total.  This virtual service is not related to other E/M service within previous 7 days.   HPI Chief Complaint  Patient presents with   Covid Positive    Positive covid- yesterday. Symptoms started Monday night- chest pain, sore throat, nose stopped up, cough,    Virtual for illness.  She notes cough, chest hurting, sweats, sore in back and chest, reduced appetite, sore throat, nose stopped.   Started 2 days ago.  Tested positive for covid yesterday.   Coughing a lot, but not wheezing or SOB.  Gets tired easily. Using some cold medication OTC.  Water intake is ok.  She has used Paxlovid in the past without any problem.  No other aggravating or relieving factors. No other complaint.   Past Medical History:  Diagnosis Date   Arthritis    knees   Bell's palsy    Cataract    removed both   Gastroesophageal reflux disease 02/17/2015   Herpes    Hypertension    Current Outpatient Medications on File Prior to Visit  Medication Sig Dispense Refill    acyclovir (ZOVIRAX) 400 MG tablet Take 1 tablet (400 mg total) by mouth 2 (two) times daily. 60 tablet 2   amLODipine (NORVASC) 10 MG tablet TAKE 1 TABLET BY MOUTH EVERY DAY 90 tablet 1   aspirin EC 81 MG tablet Take 81 mg by mouth daily. Swallow whole.     B Complex-C (SUPER B COMPLEX PO) Take by mouth.     Cholecalciferol (VITAMIN D) 50 MCG (2000 UT) CAPS Take 1 capsule (2,000 Units total) by mouth daily. 90 capsule 3   cycloSPORINE (RESTASIS) 0.05 % ophthalmic emulsion 1 drop 2 (two) times daily.     Glucosamine-Chondroit-Vit C-Mn (GLUCOSAMINE CHONDR 500 COMPLEX PO) Take by mouth.     loteprednol (LOTEMAX) 0.5 % ophthalmic suspension Place 1 drop into both eyes 4 (four) times daily.     No current facility-administered medications on file prior to visit.    Review of Systems As in subjective    Objective:   Physical Exam Due to coronavirus pandemic stay at home measures, patient visit was virtual and they were not examined in person.   Wt 145 lb (65.8 kg)   LMP 08/01/2012   BMI 26.95 kg/m   Wt Readings from Last 3 Encounters:  08/19/22 145 lb (65.8 kg)  01/21/22 141 lb (64 kg)  12/23/21 142 lb 9.6 oz (64.7 kg)    Gen: wd, wn, nad Coughing some, seems little hoarse.  Answers questions appropriate.  No labored breathing or  wheezing       Assessment:     Encounter Diagnosis  Name Primary?   COVID Yes       Plan:     We discussed symptoms and concerns.  We discussed limitations of virtual consult.  Begin Paxlovid to reduce risk of severe illness or hospitalization.  She has taken Paxlovid before and tolerated this okay.  Begin cough medicine as below.  Work on good hydration and rest.  Advised if much worse over the next few days particularly at short of breath, high fever, extreme fatigue or weakness, uncontrollable vomiting or other then go to the emergency department.  We discussed quarantine measures.  Rachel [name] was seen today for covid positive.  Diagnoses and  all orders for this visit:  COVID  Other orders -     promethazine-dextromethorphan (PROMETHAZINE-DM) 6.25-15 MG/5ML syrup; Take 5 mLs by mouth 4 (four) times daily as needed for cough. -     nirmatrelvir/ritonavir (PAXLOVID) 20 x 150 MG & 10 x 100MG  TABS; Take 3 tablets by mouth 2 (two) times daily for 5 days. (Take nirmatrelvir 150 mg two tablets twice daily for 5 days and ritonavir 100 mg one tablet twice daily for 5 days) Patient GFR is normal, not reduced    F/u prn

## 2022-08-25 ENCOUNTER — Encounter: Payer: Self-pay | Admitting: Medical

## 2022-08-25 ENCOUNTER — Telehealth: Payer: 59 | Admitting: Family Medicine

## 2022-08-25 VITALS — Wt 140.0 lb

## 2022-08-25 DIAGNOSIS — U071 COVID-19: Secondary | ICD-10-CM | POA: Diagnosis not present

## 2022-08-25 NOTE — Progress Notes (Signed)
   Subjective:    Patient ID: Rachel [name], female    DOB: 1950-03-10, 72 y.o.   MRN: [id number]  HPI Documentation for virtual audio  telecommunications through [place] encounter: The patient was located at home. 2 patient identifiers used.  The provider was located in the office. The patient did consent to this visit and is aware of possible charges through their insurance for this visit. The other persons participating in this telemedicine service were none. Time spent on call was 5 minutes and in review of previous records >17 minutes total for counseling and coordination of care. This virtual service is not related to other E/M service within previous 7 days.  She was seen August 1 for treatments of COVID and given Paxil bid and a cough medicine.  She does state that she is better but not ready to return to work.  She has noted abnormal taste, decreased appetite, fatigue sore throat with some fever and chills.  Review of Systems     Objective:    Physical Exam Patient not seen       Assessment & Plan:  COVID Recommend continuing with Tylenol for the fever aches and pains and also to use Emetrol to help with the stomach.  I reassured her that I think she is getting better.  I will give her her a note to return to work on Monday.

## 2022-11-03 ENCOUNTER — Other Ambulatory Visit: Payer: Self-pay | Admitting: Medical

## 2022-11-03 NOTE — Telephone Encounter (Signed)
Left message for pt to call back.  Overdue for a visits. Needs to schedule

## 2022-11-03 NOTE — Telephone Encounter (Signed)
Once scheduled we can send in a supply to get her to her appt

## 2022-11-09 ENCOUNTER — Other Ambulatory Visit: Payer: Self-pay | Admitting: Medical

## 2022-11-09 ENCOUNTER — Telehealth: Payer: Self-pay | Admitting: Medical

## 2022-11-09 NOTE — Telephone Encounter (Signed)
Pt scheduled med check for 10/29

## 2022-11-10 NOTE — Telephone Encounter (Signed)
Bp med was refilled yesterday

## 2022-11-16 ENCOUNTER — Ambulatory Visit: Payer: 59 | Admitting: Medical

## 2022-11-16 VITALS — BP 120/70 | HR 56 | Wt 144.8 lb

## 2022-11-16 DIAGNOSIS — R0789 Other chest pain: Secondary | ICD-10-CM | POA: Diagnosis not present

## 2022-11-16 DIAGNOSIS — M549 Dorsalgia, unspecified: Secondary | ICD-10-CM

## 2022-11-16 DIAGNOSIS — A6000 Herpesviral infection of urogenital system, unspecified: Secondary | ICD-10-CM | POA: Diagnosis not present

## 2022-11-16 DIAGNOSIS — I10 Essential (primary) hypertension: Secondary | ICD-10-CM | POA: Diagnosis not present

## 2022-11-16 DIAGNOSIS — R7303 Prediabetes: Secondary | ICD-10-CM | POA: Diagnosis not present

## 2022-11-16 DIAGNOSIS — G8929 Other chronic pain: Secondary | ICD-10-CM

## 2022-11-16 DIAGNOSIS — R630 Anorexia: Secondary | ICD-10-CM

## 2022-11-16 MED ORDER — VITAMIN D 50 MCG (2000 UT) PO CAPS: 1.0000 | ORAL_CAPSULE | Freq: Every day | ORAL | 0 refills | Status: DC

## 2022-11-16 MED ORDER — ACYCLOVIR 400 MG PO TABS
400.0000 mg | ORAL_TABLET | Freq: Two times a day (BID) | ORAL | 2 refills | Status: DC
Start: 2022-11-16 — End: 2023-03-15

## 2022-11-16 MED ORDER — AMLODIPINE BESYLATE 10 MG PO TABS: 10.0000 mg | ORAL_TABLET | Freq: Every day | ORAL | 0 refills | Status: DC

## 2022-11-16 NOTE — Patient Instructions (Signed)
She is due for yearly routine labs.  She declines these today as she says her gynecologist will do them tomorrow for her physical.   Please be so kind to ask Dr. Marcelle Overlie to send [place] a copy of your labs and office notes  I recommend you ask for the following labs tomorrow when you go for your physical: CMET CBC Lipid Vit D level Urinalysis HgbA1C  Hypertension Continue amlodipine 10 mg daily You are due for yearly labs to check your kidney and electrolytes  Vitamin D deficiency continue vitamin D supplement You are due for yearly labs to check your vitamin D and calcium/electrolyte levels  Prediabetes You are due for hemoglobin A1c diabetes screen and CMET lab to check your glucose fasting  Given your age and high blood pressure diagnosis, I recommend a CT coronary calcium test to check for cholesterol plaques in your coronary arteries.  If agreeable we can order this test for screening particular since your cholesterol blood test have been great   History of herpetic lesion, I refilled acyclovir for as needed use   Chest pain There are several different causes of chest pain including heart related issues such as angina, electrical problems of the heart, acid reflux, stress and anxiety, etiology, other Your EKG shows a slow heart rate but nothing else unusual The neck step would be routine labs If you feel like you get weird beats of your heart then we should set you up for a monitor that you wear for 8 days to check your heart rhythm throughout the day to see if there is anything unusual.  Let me know if you want to pursue this   Back and leg pain Consider a baseline x-ray of your low back Your leg exam was unremarkable today  Please go to [facility] for your back xray.   Their hours are 8am - 4:30 pm Monday - Friday.  Take your insurance card with you.  [facility] [phone]   [address] [place], [place] [address]

## 2022-11-16 NOTE — Progress Notes (Addendum)
Subjective:  Rachel [name] is a 72 y.o. female who presents for Chief Complaint  Patient presents with   Medical Management of Chronic Issues    Med check. Left leg and foot is swelling     Here for med check.   Medical team: Dr. Ileene Patrick, GI Dr. Richarda Overlie, gyn Dentist Eye doctor Brittany Amirault, Kermit Balo, PA-C here for primary care   Concerns:  She reports that she has her annual physical with her gynecologist tomorrow.  She does not want to do blood work today  Hypertension-compliant with amlodipine 10 mg daily.  Vitamin D deficiency-compliant with vitamin D supplement daily  Uses acyclovir as needed  She notes intermittent pains in her chest wall that sometimes aches to her back.  Not associated with exercise or activity.  It can be random.  Sometimes feels the pain at work, sometimes feels the pain when she lays down at night.  No wheezing, no shortness of breath.  She notes in general that she does not eat a lot and does not have a major appetite.  This has been a long-term issue for years  She notes some leg pains in her left lower leg.  No injury.  Feels a little swollen.  No other aggravating or relieving factors.    No other c/o.  Past Medical History:  Diagnosis Date   Arthritis    knees   Bell's palsy    Cataract    removed both   Gastroesophageal reflux disease 02/17/2015   Herpes    Hypertension    Current Outpatient Medications on File Prior to Visit  Medication Sig Dispense Refill   aspirin EC 81 MG tablet Take 81 mg by mouth daily. Swallow whole.     cycloSPORINE (RESTASIS) 0.05 % ophthalmic emulsion 1 drop 2 (two) times daily.     Iron-Vitamins (GERITOL PO) Take by mouth.     loteprednol (LOTEMAX) 0.5 % ophthalmic suspension Place 1 drop into both eyes 4 (four) times daily.     No current facility-administered medications on file prior to visit.     The following portions of the patient's history were reviewed and updated as  appropriate: allergies, current medications, past family history, past medical history, past social history, past surgical history and problem list.  ROS Otherwise as in subjective above    Objective: BP 120/70   Pulse (!) 56   Wt 144 lb 12.8 oz (65.7 kg)   LMP 08/01/2012   BMI 26.92 kg/m   General appearance: alert, no distress, well developed, well nourished Neck: supple, no lymphadenopathy, no thyromegaly, no masses, no JVD  Heart: RRR, normal S1, S2, no murmurs Lungs: CTA bilaterally, no wheezes, rhonchi, or rales Abdomen: +bs, soft, non tender, non distended, no masses, no hepatomegaly, no splenomegaly Back with some mild tenderness over the bilateral SI joint area, range of motion with some mild pain, range of motion to about 90% of normal Legs nontender with no obvious deformity or swelling.  No obvious swelling of the left lower leg and nontender left lower leg, no leg asymmetry of calves, negative Homans Pulses: 2+ radial pulses, 2+ pedal pulses, normal cap refill Ext: no edema Legs neurovascularly intact Chest wall nontender with normal I/E   EKG with bradycardia, otherwise unremarkable    Assessment: Encounter Diagnoses  Name Primary?   Essential hypertension Yes   Genital herpes simplex, unspecified site    Prediabetes    Other chest pain    Chronic back pain, unspecified  back location, unspecified back pain laterality    Decreased appetite      Plan: She is due for yearly routine labs.  She declines these today as she says her gynecologist will do them tomorrow for her physical.   Please be so kind to ask Dr. Marcelle Overlie to send [place] a copy of your labs and office notes  I recommend you ask for the following labs tomorrow when you go for your physical: CMET CBC Lipid Vit D level Urinalysis HgbA1C  Hypertension Continue amlodipine 10 mg daily You are due for yearly labs to check your kidney and electrolytes  Vitamin D deficiency continue vitamin D  supplement You are due for yearly labs to check your vitamin D and calcium/electrolyte levels  Prediabetes You are due for hemoglobin A1c diabetes screen and CMET lab to check your glucose fasting  Given your age and high blood pressure diagnosis, I recommend a CT coronary calcium test to check for cholesterol plaques in your coronary arteries.  If agreeable we can order this test for screening particular since your cholesterol blood test have been great   History of herpetic lesion, I refilled acyclovir for as needed use   Chest pain There are several different causes of chest pain including heart related issues such as angina, electrical problems of the heart, acid reflux, stress and anxiety, etiology, other Your EKG shows a slow heart rate but nothing else unusual The neck step would be routine labs If you feel like you get weird beats of your heart then we should set you up for a monitor that you wear for 8 days to check your heart rhythm throughout the day to see if there is anything unusual.  Let me know if you want to pursue this   Back and leg pain Consider a baseline x-ray of your low back, and if you continue to have pains in left foot or ankle, we can do xray of that area as well. Your leg exam was unremarkable today  Please go to [facility] for your back xray.   Their hours are 8am - 4:30 pm Monday - Friday.  Take your insurance card with you.  [facility] [phone]   [address] [place], [place] [address]     Katy was seen today for medical management of chronic issues.  Diagnoses and all orders for this visit:  Essential hypertension  Genital herpes simplex, unspecified site -     acyclovir (ZOVIRAX) 400 MG tablet; Take 1 tablet (400 mg total) by mouth 2 (two) times daily.  Prediabetes  Other chest pain -     EKG 12-Lead  Chronic back pain, unspecified back location, unspecified back pain laterality -     DG Lumbar Spine Complete;  Future  Decreased appetite  Other orders -     Cholecalciferol (VITAMIN D) 50 MCG (2000 UT) CAPS; Take 1 capsule (2,000 Units total) by mouth daily. -     amLODipine (NORVASC) 10 MG tablet; Take 1 tablet (10 mg total) by mouth daily.    Follow up: pending xray, labs from gyn

## 2022-11-17 LAB — BASIC METABOLIC PANEL
Chloride: 102 (ref 99–108)
Glucose: 91
Potassium: 4.1 meq/L (ref 3.5–5.1)
Sodium: 142 (ref 137–147)

## 2022-11-17 LAB — HM MAMMOGRAPHY

## 2022-11-17 LAB — CBC: RBC: 4.73 (ref 3.87–5.11)

## 2022-11-18 LAB — LIPID PANEL
Cholesterol: 168 (ref 0–200)
HDL: 101 — AB (ref 35–70)
LDL Cholesterol: 59
Triglycerides: 32 — AB (ref 40–160)

## 2022-11-18 LAB — VITAMIN D 25 HYDROXY (VIT D DEFICIENCY, FRACTURES): Vit D, 25-Hydroxy: 43.6

## 2022-11-18 LAB — COMPREHENSIVE METABOLIC PANEL: EGFR: 95

## 2022-11-18 LAB — CBC AND DIFFERENTIAL
HCT: 41 (ref 36–46)
Hemoglobin: 13.1 (ref 12.0–16.0)
Platelets: 238 10*3/uL (ref 150–400)
WBC: 6.3

## 2022-11-18 LAB — HEMOGLOBIN A1C: Hemoglobin A1C: 6.5

## 2022-11-19 ENCOUNTER — Encounter: Payer: Self-pay | Admitting: Internal Medicine

## 2022-12-14 ENCOUNTER — Ambulatory Visit: Payer: 59 | Admitting: Medical

## 2022-12-14 ENCOUNTER — Ambulatory Visit
Admission: RE | Admit: 2022-12-14 | Discharge: 2022-12-14 | Disposition: A | Discharge status: Home / Self Care | Payer: 59 | Source: Ambulatory Visit | Attending: Medical | Admitting: Medical

## 2022-12-14 VITALS — BP 118/60 | HR 79 | Temp 97.9°F | Wt 143.6 lb

## 2022-12-14 DIAGNOSIS — R051 Acute cough: Secondary | ICD-10-CM | POA: Diagnosis not present

## 2022-12-14 DIAGNOSIS — K08409 Partial loss of teeth, unspecified cause, unspecified class: Secondary | ICD-10-CM | POA: Diagnosis not present

## 2022-12-14 DIAGNOSIS — R0602 Shortness of breath: Secondary | ICD-10-CM

## 2022-12-14 LAB — CBC WITH DIFFERENTIAL/PLATELET
Basophils Absolute: 0.1 10*3/uL (ref 0.0–0.2)
Basos: 0 %
EOS (ABSOLUTE): 0.1 10*3/uL (ref 0.0–0.4)
Eos: 1 %
Hematocrit: 33.6 % — ABNORMAL LOW (ref 34.0–46.6)
Hemoglobin: 10.6 g/dL — ABNORMAL LOW (ref 11.1–15.9)
Immature Grans (Abs): 0.1 10*3/uL (ref 0.0–0.1)
Immature Granulocytes: 1 %
Lymphocytes Absolute: 2.7 10*3/uL (ref 0.7–3.1)
Lymphs: 19 %
MCH: 27.6 pg (ref 26.6–33.0)
MCHC: 31.5 g/dL (ref 31.5–35.7)
MCV: 88 fL (ref 79–97)
Monocytes Absolute: 1 10*3/uL — ABNORMAL HIGH (ref 0.1–0.9)
Monocytes: 7 %
Neutrophils Absolute: 10.4 10*3/uL — ABNORMAL HIGH (ref 1.4–7.0)
Neutrophils: 72 %
Platelets: 186 10*3/uL (ref 150–450)
RBC: 3.84 x10E6/uL (ref 3.77–5.28)
RDW: 12.4 % (ref 11.7–15.4)
WBC: 14.3 10*3/uL — ABNORMAL HIGH (ref 3.4–10.8)

## 2022-12-14 LAB — POCT INFLUENZA A/B
Influenza A, POC: NEGATIVE
Influenza B, POC: NEGATIVE

## 2022-12-14 LAB — POC COVID19 BINAXNOW: SARS Coronavirus 2 Ag: NEGATIVE

## 2022-12-14 MED ORDER — BENZONATATE 100 MG PO CAPS
100.0000 mg | ORAL_CAPSULE | Freq: Two times a day (BID) | ORAL | 0 refills | Status: DC | PRN
Start: 2022-12-14 — End: 2023-02-15

## 2022-12-14 MED ORDER — ALBUTEROL SULFATE HFA 108 (90 BASE) MCG/ACT IN AERS
2.0000 | INHALATION_SPRAY | Freq: Four times a day (QID) | RESPIRATORY_TRACT | 0 refills | Status: DC | PRN
Start: 2022-12-14 — End: 2023-10-04

## 2022-12-14 MED ORDER — AZITHROMYCIN 250 MG PO TABS: ORAL_TABLET | ORAL | 0 refills | Status: DC

## 2022-12-14 NOTE — Patient Instructions (Signed)
Please go to [facility] for your chest xray.   Their hours are 8am - 4:30 pm Monday - Friday.  Take your insurance card with you.  [facility] [phone]  [address] [place], [place] [address]   If after 5:30pm, go to [facility] or [facility] emergency dept and ask for the xray department.   Recommendations: Begin Z-Pak antibiotic Begin albuterol inhaler 1 to 2 puffs 2-3 times a day to help with shortness of breath, cough or wheezing.  You may feel little jittery after using this medication Drink plenty of fluids and get rest over the next few days You can use Tylenol as needed for pain or fever You can continue the over-the-counter medicine for congestion I did send a prescription for Tessalon Perle cough drops if needed you can use alternating with the over-the-counter remedy  We will call with lab and chest x-ray results

## 2022-12-14 NOTE — Progress Notes (Signed)
Subjective:  Rachel [name] is a 72 y.o. female who presents for Chief Complaint  Patient presents with   Cough    Coughing since Saturday, chest congestion- mucous- brown. Had dentist work done on Friday. Coughing hurts her chest as the cough is deep     Here for illness.  She had her wisdom teeth taken out in the right upper 4 days ago.  She has felt bad ever since then.  Currently she notes 4 days of cough, chest congestion, deep cough, feels back, sore throat.  No ear pain, no nausea vomiting diarrhea, no fever.  No sick contacts specifically.  She has taken some over-the-counter flu and cold medicine which helps some.  Has a burning sensation in her chest.  No other aggravating or relieving factors.    No other c/o.  Past Medical History:  Diagnosis Date   Arthritis    knees   Bell's palsy    Cataract    removed both   Gastroesophageal reflux disease 02/17/2015   Herpes    Hypertension    Current Outpatient Medications on File Prior to Visit  Medication Sig Dispense Refill   acyclovir (ZOVIRAX) 400 MG tablet Take 1 tablet (400 mg total) by mouth 2 (two) times daily. (Patient not taking: Reported on 12/14/2022) 60 tablet 2   amLODipine (NORVASC) 10 MG tablet Take 1 tablet (10 mg total) by mouth daily. (Patient not taking: Reported on 12/14/2022) 90 tablet 0   aspirin EC 81 MG tablet Take 81 mg by mouth daily. Swallow whole. (Patient not taking: Reported on 12/14/2022)     Cholecalciferol (VITAMIN D) 50 MCG (2000 UT) CAPS Take 1 capsule (2,000 Units total) by mouth daily. (Patient not taking: Reported on 12/14/2022) 90 capsule 0   cycloSPORINE (RESTASIS) 0.05 % ophthalmic emulsion 1 drop 2 (two) times daily. (Patient not taking: Reported on 12/14/2022)     Iron-Vitamins (GERITOL PO) Take by mouth. (Patient not taking: Reported on 12/14/2022)     loteprednol (LOTEMAX) 0.5 % ophthalmic suspension Place 1 drop into both eyes 4 (four) times daily. (Patient not taking: Reported on  12/14/2022)     No current facility-administered medications on file prior to visit.     The following portions of the patient's history were reviewed and updated as appropriate: allergies, current medications, past family history, past medical history, past social history, past surgical history and problem list.  ROS Otherwise as in subjective above    Objective: BP 118/60   Pulse 79   Temp 97.9 F (36.6 C)   Wt 143 lb 9.6 oz (65.1 kg)   LMP 08/01/2012   SpO2 98%   BMI 26.69 kg/m   General appearance: alert, no distress, well developed, well nourished, somewhat ill appearing HEENT: normocephalic, sclerae anicteric, conjunctiva pink and moist, TMs flat, nares with clear discharge, + erythema, pharynx normal Oral cavity: MMM, no lesions, pinkish/gray area right upper gum s/p recent wisdom teeth extraction Neck: supple, no lymphadenopathy, no thyromegaly, no masses Heart: RRR, normal S1, S2, no murmurs Lungs: Rhonchi on the right lung fields upper and lower, decreased breath sounds right side in general, no wheezing  pulses: 2+ radial pulses, 2+ pedal pulses, normal cap refill Ext: no edema    Assessment: Encounter Diagnoses  Name Primary?   Acute cough Yes   SOB (shortness of breath)    History of third molar tooth extraction, unspecified edentulism class      Plan: Discussed symptoms, concerns, likelihood of pneumonia.  Flu  and COVID test negative today.  Patient Instructions  Please go to [facility] for your chest xray.   Their hours are 8am - 4:30 pm Monday - Friday.  Take your insurance card with you.  [facility] [phone]  [address] [place], [place] [address]   If after 5:30pm, go to [facility] or [facility] emergency dept and ask for the xray department.   Recommendations: Begin Z-Pak antibiotic Begin albuterol inhaler 1 to 2 puffs 2-3 times a day to help with shortness of breath, cough or wheezing.  You may feel little  jittery after using this medication Drink plenty of fluids and get rest over the next few days You can use Tylenol as needed for pain or fever You can continue the over-the-counter medicine for congestion I did send a prescription for Tessalon Perle cough drops if needed you can use alternating with the over-the-counter remedy  We will call with lab and chest x-ray results    [name] was seen today for cough.  Diagnoses and all orders for this visit:  Acute cough -     DG Chest 2 View; Future -     CBC with Differential/Platelet -     POC COVID-19 -     Influenza A/B  SOB (shortness of breath) -     DG Chest 2 View; Future -     CBC with Differential/Platelet  History of third molar tooth extraction, unspecified edentulism class -     DG Chest 2 View; Future -     CBC with Differential/Platelet  Other orders -     albuterol (VENTOLIN HFA) 108 (90 Base) MCG/ACT inhaler; Inhale 2 puffs into the lungs every 6 (six) hours as needed for wheezing or shortness of breath. -     azithromycin (ZITHROMAX) 250 MG tablet; 2 tablets day 1, then 1 tablet days 2-4 -     benzonatate (TESSALON) 100 MG capsule; Take 1 capsule (100 mg total) by mouth 2 (two) times daily as needed for cough.   Follow up: pending chest xray

## 2022-12-15 NOTE — Progress Notes (Signed)
Thankfully the chest x-ray looks okay although your lung exam was not okay.  Blood counts are elevated suggesting infection so continue the same medications prescribed yesterday  Rest, hydrate well, use the inhaler 2-3 times a day for shortness of breath cough and to get mucus out.  If worse over the next few days, particularly if you have a hard time breathing, extreme weakness or uncontrollable vomiting, then go to the emergency department  Otherwise let me know on Monday how you are doing

## 2022-12-21 ENCOUNTER — Encounter: Payer: Self-pay | Admitting: Medical

## 2022-12-21 ENCOUNTER — Ambulatory Visit: Payer: 59 | Admitting: Medical

## 2022-12-21 VITALS — BP 128/70 | HR 66 | Temp 98.8°F | Ht 62.0 in | Wt 146.2 lb

## 2022-12-21 DIAGNOSIS — J988 Other specified respiratory disorders: Secondary | ICD-10-CM

## 2022-12-21 DIAGNOSIS — R052 Subacute cough: Secondary | ICD-10-CM

## 2022-12-21 DIAGNOSIS — R062 Wheezing: Secondary | ICD-10-CM | POA: Diagnosis not present

## 2022-12-21 MED ORDER — PREDNISONE 20 MG PO TABS: ORAL_TABLET | ORAL | 0 refills | Status: DC

## 2022-12-21 MED ORDER — HYDROCODONE BIT-HOMATROP MBR 5-1.5 MG/5ML PO SOLN: 5.0000 mL | Freq: Three times a day (TID) | ORAL | 0 refills | Status: AC | PRN

## 2022-12-21 NOTE — Patient Instructions (Signed)
Your lungs sound much better today than last visit  Recommendations: Continue to hydrate well with water throughout the day Work on deep long inhale with your albuterol inhaler.  Use the albuterol inhaler 2 or 3 times a day for the next 5 days.  After 5 days use as needed for shortness of breath, tightness in the chest or wheezing Begin prednisone steroid 3-day pack to reduce inflammation in the lungs I prescribed Hycodan syrup.  This is a narcotic cough syrup.  You can use this once or twice a day for the next 2 days for cough You still have some of the Tessalon Perle cough drops at home as well.  You can alternate this with hydrocodone cough syrup.  Do not take them within 4 hours of each other. Consider allergy pill such as Benadryl or Zyrtec for the next few days to help with nasal congestion. Consider nasal saline flush such as Nettie pot to flush out the congestion from the nose If not much improved by Friday let me know I do want to repeat your blood count in a couple weeks to make sure you are red blood cells and white blood cells are back to normal.   This can be a nurse visit for lab only

## 2022-12-21 NOTE — Progress Notes (Signed)
Subjective:  Rachel [name] is a 72 y.o. female who presents for Chief Complaint  Patient presents with   Cough    Has had a cough for almost 2 weeks, keeping her up at night.      Here for follow-up.  I saw her on 12/14/2022 for respiratory tract infection, cough.  At that time she had abnormal lung sounds, wheezing, felt quite bad.  She is improving but still has quite a bit of cough.  She finished antibiotic Z-Pak.  She has tried albuterol but does not think it is helping.  Similarly she has used the Tessalon cough drops but not sure that helped.  No fever. current symptoms include cough, chest discomfort, white phlegm now instead of colored as before.  She went back to work yesterday for the first time since her illness last visit.  She does have some soreness in the upper back and across the chest wall.  No severe pain.  No palpitations.  No edema.  No other aggravating or relieving factors.    No other c/o.  Past Medical History:  Diagnosis Date   Arthritis    knees   Bell's palsy    Cataract    removed both   Gastroesophageal reflux disease 02/17/2015   Herpes    Hypertension    Current Outpatient Medications on File Prior to Visit  Medication Sig Dispense Refill   amLODipine (NORVASC) 10 MG tablet Take 1 tablet (10 mg total) by mouth daily. 90 tablet 0   benzonatate (TESSALON) 100 MG capsule Take 1 capsule (100 mg total) by mouth 2 (two) times daily as needed for cough. 20 capsule 0   Cholecalciferol (VITAMIN D) 50 MCG (2000 UT) CAPS Take 1 capsule (2,000 Units total) by mouth daily. 90 capsule 0   zinc gluconate 50 MG tablet Take 50 mg by mouth daily.     acyclovir (ZOVIRAX) 400 MG tablet Take 1 tablet (400 mg total) by mouth 2 (two) times daily. (Patient not taking: Reported on 12/14/2022) 60 tablet 2   albuterol (VENTOLIN HFA) 108 (90 Base) MCG/ACT inhaler Inhale 2 puffs into the lungs every 6 (six) hours as needed for wheezing or shortness of breath. (Patient not  taking: Reported on 12/21/2022) 18 g 0   aspirin EC 81 MG tablet Take 81 mg by mouth daily. Swallow whole. (Patient not taking: Reported on 12/14/2022)     azithromycin (ZITHROMAX) 250 MG tablet 2 tablets day 1, then 1 tablet days 2-4 (Patient not taking: Reported on 12/21/2022) 6 tablet 0   No current facility-administered medications on file prior to visit.     The following portions of the patient's history were reviewed and updated as appropriate: allergies, current medications, past family history, past medical history, past social history, past surgical history and problem list.  ROS Otherwise as in subjective above    Objective: BP 128/70   Pulse 66   Temp 98.8 F (37.1 C) (Tympanic)   Ht 5\' 2"  (1.575 m)   Wt 146 lb 3.2 oz (66.3 kg)   LMP 08/01/2012   SpO2 96%   BMI 26.74 kg/m   General appearance: alert, no distress, well developed, well nourished, somewhat ill appearing HEENT: normocephalic, sclerae anicteric, conjunctiva pink and moist, TMs flat, nares with clear discharge, no erythema, pharynx normal Oral cavity: MMM, no lesions, pinkish/gray area right upper gum s/p recent wisdom teeth extraction Neck: supple, no lymphadenopathy, no thyromegaly, no masses Heart: RRR, normal S1, S2, no murmurs Lungs: clear  today, no wheezing , no rhonchi pulses: 2+ radial pulses, 2+ pedal pulses, normal cap refill Ext: no edema    Assessment: Encounter Diagnoses  Name Primary?   Subacute cough Yes   Wheezing    Respiratory tract infection       Plan: Your lungs sound much better today than last visit  Recommendations: Continue to hydrate well with water throughout the day Work on deep long inhale with your albuterol inhaler.  Use the albuterol inhaler 2 or 3 times a day for the next 5 days.  After 5 days use as needed for shortness of breath, tightness in the chest or wheezing Begin prednisone steroid 3-day pack to reduce inflammation in the lungs I prescribed Hycodan  syrup.  This is a narcotic cough syrup.  You can use this once or twice a day for the next 2 days for cough You still have some of the Tessalon Perle cough drops at home as well.  You can alternate this with hydrocodone cough syrup.  Do not take them within 4 hours of each other. Consider allergy pill such as Benadryl or Zyrtec for the next few days to help with nasal congestion. Consider nasal saline flush such as Nettie pot to flush out the congestion from the nose If not much improved by Friday let me know I do want to repeat your blood count in a couple weeks to make sure you are red blood cells and white blood cells are back to normal.   This can be a nurse visit for lab only  There are no Patient Instructions on file for this visit.    [name] was seen today for cough.  Diagnoses and all orders for this visit:  Subacute cough -     CBC with Differential/Platelet; Future  Wheezing -     CBC with Differential/Platelet; Future  Respiratory tract infection -     CBC with Differential/Platelet; Future  Other orders -     predniSONE (DELTASONE) 20 MG tablet; 3 tablets today, 2 tablets tomorrow, 1 tablet the third day -     HYDROcodone bit-homatropine (HYCODAN) 5-1.5 MG/5ML syrup; Take 5 mLs by mouth every 8 (eight) hours as needed for up to 5 days for cough.    Follow up: 2 weeks for lab

## 2023-01-05 LAB — HM DEXA SCAN: HM Dexa Scan: NORMAL

## 2023-01-11 ENCOUNTER — Other Ambulatory Visit: Payer: Self-pay | Admitting: Medical

## 2023-02-15 ENCOUNTER — Ambulatory Visit (INDEPENDENT_AMBULATORY_CARE_PROVIDER_SITE_OTHER): Payer: 59 | Admitting: Medical

## 2023-02-15 VITALS — BP 110/70 | HR 62 | Temp 97.3°F | Wt 146.4 lb

## 2023-02-15 DIAGNOSIS — R0789 Other chest pain: Secondary | ICD-10-CM | POA: Diagnosis not present

## 2023-02-15 DIAGNOSIS — Z136 Encounter for screening for cardiovascular disorders: Secondary | ICD-10-CM

## 2023-02-15 DIAGNOSIS — R1010 Upper abdominal pain, unspecified: Secondary | ICD-10-CM | POA: Diagnosis not present

## 2023-02-15 DIAGNOSIS — R7989 Other specified abnormal findings of blood chemistry: Secondary | ICD-10-CM | POA: Diagnosis not present

## 2023-02-15 LAB — CBC WITH DIFFERENTIAL/PLATELET
Basophils Absolute: 0.1 10*3/uL (ref 0.0–0.2)
Basos: 1 %
EOS (ABSOLUTE): 0.1 10*3/uL (ref 0.0–0.4)
Eos: 3 %
Hematocrit: 38.1 % (ref 34.0–46.6)
Hemoglobin: 12.2 g/dL (ref 11.1–15.9)
Immature Grans (Abs): 0 10*3/uL (ref 0.0–0.1)
Immature Granulocytes: 0 %
Lymphocytes Absolute: 2.3 10*3/uL (ref 0.7–3.1)
Lymphs: 40 %
MCH: 27.9 pg (ref 26.6–33.0)
MCHC: 32 g/dL (ref 31.5–35.7)
MCV: 87 fL (ref 79–97)
Monocytes Absolute: 0.5 10*3/uL (ref 0.1–0.9)
Monocytes: 8 %
Neutrophils Absolute: 2.7 10*3/uL (ref 1.4–7.0)
Neutrophils: 48 %
Platelets: 191 10*3/uL (ref 150–450)
RBC: 4.38 x10E6/uL (ref 3.77–5.28)
RDW: 12.8 % (ref 11.7–15.4)
WBC: 5.7 10*3/uL (ref 3.4–10.8)

## 2023-02-15 LAB — SEDIMENTATION RATE: Sed Rate: 8 mm/h (ref 0–40)

## 2023-02-15 MED ORDER — TIZANIDINE HCL 4 MG PO TABS: 4.0000 mg | ORAL_TABLET | Freq: Two times a day (BID) | ORAL | 0 refills | Status: DC | PRN

## 2023-02-15 MED ORDER — NAPROXEN 375 MG PO TBEC: 1.0000 | DELAYED_RELEASE_TABLET | Freq: Two times a day (BID) | ORAL | 0 refills | Status: DC

## 2023-02-15 NOTE — Progress Notes (Signed)
Subjective:  Rachel [name] is a 73 y.o. female who presents for Chief Complaint  Patient presents with   pain under breast    Pain under left breast. It woke her up yesterday in pain. Sometimes when touching it, it a little sore     Here for complaint of acute pain of chest wall under left breast area.  Started by waking her up out of sleep.  Doesn't recall injury or fall or trauma, but has been cleaning around the house.  No pain or lump in breasts.   No rash.   No hx/o shingles.   Pain seems to radiate around to midline and right chest as well.   No problems breathing.  No nausea, no belly upset.   No shortness of breath, no wheezing, no dizziness, no lightheadedness.  She notes raking some leaves in the last few days and has been putting stuff away in boxes lifting and turning in recent days.  No other aggravating or relieving factors.    No other c/o.  Past Medical History:  Diagnosis Date   Arthritis    knees   Bell's palsy    Cataract    removed both   Gastroesophageal reflux disease 02/17/2015   Herpes    Hypertension    Current Outpatient Medications on File Prior to Visit  Medication Sig Dispense Refill   acyclovir (ZOVIRAX) 400 MG tablet Take 1 tablet (400 mg total) by mouth 2 (two) times daily. 60 tablet 2   amLODipine (NORVASC) 10 MG tablet Take 1 tablet (10 mg total) by mouth daily. 90 tablet 0   aspirin EC 81 MG tablet Take 81 mg by mouth daily. Swallow whole.     Cholecalciferol (VITAMIN D) 50 MCG (2000 UT) CAPS Take 1 capsule (2,000 Units total) by mouth daily. 90 capsule 0   zinc gluconate 50 MG tablet Take 50 mg by mouth daily.     albuterol (VENTOLIN HFA) 108 (90 Base) MCG/ACT inhaler Inhale 2 puffs into the lungs every 6 (six) hours as needed for wheezing or shortness of breath. (Patient not taking: Reported on 02/15/2023) 18 g 0   No current facility-administered medications on file prior to visit.     The following portions of the patient's history  were reviewed and updated as appropriate: allergies, current medications, past family history, past medical history, past social history, past surgical history and problem list.  ROS Otherwise as in subjective above    Objective: BP 110/70   Pulse 62   Temp (!) 97.3 F (36.3 C)   Wt 146 lb 6.4 oz (66.4 kg)   LMP 08/01/2012   SpO2 98%   BMI 26.78 kg/m   General appearance: alert, no distress, well developed, well nourished Neck: supple, no lymphadenopathy, no thyromegaly, no masses Heart: RRR, normal S1, S2, no murmurs Lungs: CTA bilaterally, no wheezes, rhonchi, or rales She is tender over the chest wall inferior to the breast bilaterally left and right and sternum and similarly in the lower ribs in the back as well.  She also seems to have some pain with moving her torso or moving her arms suggestive of musculoskeletal pain.  Otherwise chest unremarkable, normal inspiration expiration Lower back nontender Abdomen: +bs, soft, mild tenderness just below the ribs upper abdomen left and right.  Otherwise non tender, non distended, no masses, no hepatomegaly, no splenomegaly Pulses: 2+ radial pulses, 2+ pedal pulses, normal cap refill Ext: no edema   Assessment: Encounter Diagnoses  Name Primary?  Chest wall pain Yes   Upper abdominal pain    Musculoskeletal chest pain    Screening for heart disease    Abnormal CBC      Plan: You have acute chest wall pain that just started yesterday.  I suspect this is musculoskeletal related.  You have been doing some physical labor in the last few days that probably aggravated some of the muscles in your chest wall.  I do not suspect this is related to your heart at all.  You have no other heart or lung symptoms to explain the pain  Recommendations: Begin Naprosyn prescription anti-inflammatory 1 tablet twice a day for the next 5 to 7 days for pain and inflammation Begin tizanidine muscle relaxer either at bedtime or possibly up to twice  a day for the next few days to help with muscle spasm and irritation in the muscle.  Caution as this medication can make you sleepy  Keep in mind you have had this type of chest pain before.  You had a chest x-ray in November 2024 that did not show any abnormality.  When you had a similar pain last year you had a rib x-ray that was normal  If you have any new symptoms in the next few days such as rash, fever, abdominal pain, nausea, cough, shortness of breath or other new symptom then let me know recheck right away.   Since you were talking about your heart today, there is a heart disease screen that you may want to consider called a CT coronary heart test.  This is a $95 cash pay screening to look for cholesterol buildup in your coronary arteries.  If you would like to do this we will place an order.  Keep in mind you had a cholesterol panel in October 2024 to look good.  You do not have risk factors for heart disease such as smoking but you do have high blood pressure and you are over 65 which does give you to risk factors for heart disease.  You had an EKG of your heart in October 2024 for the did not show anything worrisome.   You had an abnormal blood count late 2024 whereas prior blood counts have been normal.  I am going to recheck your blood count today.  Anytime you do physical activity or more intense activity than typical, is not unusual to have soreness in the muscles over the next 2 to 3 days.   Marli was seen today for pain under breast.  Diagnoses and all orders for this visit:  Chest wall pain -     CBC with Differential/Platelet -     Sedimentation rate  Upper abdominal pain  Musculoskeletal chest pain  Screening for heart disease  Abnormal CBC -     CBC with Differential/Platelet -     Sedimentation rate  Other orders -     tiZANidine (ZANAFLEX) 4 MG tablet; Take 1 tablet (4 mg total) by mouth 2 (two) times daily as needed for muscle spasms. -     Naproxen 375  MG TBEC; Take 1 tablet (375 mg total) by mouth in the morning and at bedtime.    Follow up: pending labs

## 2023-02-15 NOTE — Patient Instructions (Signed)
You have acute chest wall pain that just started yesterday.  I suspect this is musculoskeletal related.  You have been doing some physical labor in the last few days that probably aggravated some of the muscles in your chest wall.  I do not suspect this is related to your heart at all.  You have no other heart or lung symptoms to explain the pain  Recommendations: Begin Naprosyn prescription anti-inflammatory 1 tablet twice a day for the next 5 to 7 days for pain and inflammation Begin tizanidine muscle relaxer either at bedtime or possibly up to twice a day for the next few days to help with muscle spasm and irritation in the muscle.  Caution as this medication can make you sleepy  Keep in mind you have had this type of chest pain before.  You had a chest x-ray in November 2024 that did not show any abnormality.  When you had a similar pain last year you had a rib x-ray that was normal  If you have any new symptoms in the next few days such as rash, fever, abdominal pain, nausea, cough, shortness of breath or other new symptom then let me know recheck right away.   Since you were talking about your heart today, there is a heart disease screen that you may want to consider called a CT coronary heart test.  This is a $95 cash pay screening to look for cholesterol buildup in your coronary arteries.  If you would like to do this we will place an order.  Keep in mind you had a cholesterol panel in October 2024 to look good.  You do not have risk factors for heart disease such as smoking but you do have high blood pressure and you are over 65 which does give you to risk factors for heart disease.  You had an EKG of your heart in October 2024 for the did not show anything worrisome.   You had an abnormal blood count late 2024 whereas prior blood counts have been normal.  I am going to recheck your blood count today.  Anytime you do physical activity or more intense activity than typical, is not unusual to  have soreness in the muscles over the next 2 to 3 days.

## 2023-02-16 ENCOUNTER — Other Ambulatory Visit: Payer: Self-pay | Admitting: Medical

## 2023-02-16 DIAGNOSIS — Z136 Encounter for screening for cardiovascular disorders: Secondary | ICD-10-CM

## 2023-02-16 NOTE — Progress Notes (Signed)
Blood counts normal, sed rate marker for inflammation normal use the medicaiton and recommendations discussed yesterday Let me know if agreeable for the CT coronary screen

## 2023-02-18 ENCOUNTER — Other Ambulatory Visit: Payer: Self-pay | Admitting: Medical

## 2023-02-22 ENCOUNTER — Ambulatory Visit (HOSPITAL_COMMUNITY): Payer: 59

## 2023-02-25 ENCOUNTER — Ambulatory Visit (HOSPITAL_COMMUNITY)
Admission: RE | Admit: 2023-02-25 | Discharge: 2023-02-25 | Disposition: A | Discharge status: Home / Self Care | Payer: Self-pay | Source: Ambulatory Visit | Attending: Medical | Admitting: Medical

## 2023-02-25 DIAGNOSIS — Z136 Encounter for screening for cardiovascular disorders: Secondary | ICD-10-CM | POA: Insufficient documentation

## 2023-02-28 NOTE — Progress Notes (Signed)
 Thankfully the CT coronary heart study shows 0 cholesterol buildup in your coronary arteries!

## 2023-03-01 ENCOUNTER — Other Ambulatory Visit: Payer: Self-pay | Admitting: Medical

## 2023-03-01 MED ORDER — LIDOCAINE 5 % EX PTCH: 1.0000 | MEDICATED_PATCH | Freq: Two times a day (BID) | CUTANEOUS | 0 refills | Status: DC

## 2023-03-02 ENCOUNTER — Telehealth: Payer: Self-pay

## 2023-03-02 ENCOUNTER — Other Ambulatory Visit (HOSPITAL_COMMUNITY): Payer: Self-pay

## 2023-03-02 NOTE — Telephone Encounter (Signed)
Pharmacy Patient Advocate Encounter   Received notification from Physician's Office that prior authorization for Lidocaine 5% patches  is required/requested.   Insurance verification completed.   The patient is insured through [facility] MEDICARE PARTD .   Per test claim: PA required; PA submitted to above mentioned insurance via CoverMyMeds Key/confirmation #/EOC (Key: [id number])  Status is pending

## 2023-03-02 NOTE — Telephone Encounter (Signed)
Please confirm ICD-10 Code you would like me to use for the Prior Authorization for Lidocaine 5% Patches

## 2023-03-03 NOTE — Telephone Encounter (Signed)
Pt was notified. She is currently not hurting so she will wait

## 2023-03-03 NOTE — Telephone Encounter (Signed)
Pharmacy Patient Advocate Encounter  Received notification from [facility] that Prior Authorization for Lidocaine 5% patches  has been DENIED.  Full denial letter will be uploaded to the media tab. See denial reason below.   Per Ins:ICD-10 provided is not FDA Approved

## 2023-03-15 ENCOUNTER — Other Ambulatory Visit: Payer: Self-pay | Admitting: Medical

## 2023-03-15 DIAGNOSIS — A6000 Herpesviral infection of urogenital system, unspecified: Secondary | ICD-10-CM

## 2023-03-31 ENCOUNTER — Other Ambulatory Visit (HOSPITAL_BASED_OUTPATIENT_CLINIC_OR_DEPARTMENT_OTHER): Payer: 59

## 2023-05-02 ENCOUNTER — Other Ambulatory Visit: Payer: Self-pay | Admitting: Medical

## 2023-05-27 ENCOUNTER — Ambulatory Visit: Payer: Self-pay

## 2023-05-27 NOTE — Telephone Encounter (Addendum)
  Chief Complaint: Knee pain Symptoms: bilateral knee pain Frequency: unknown amount of time Pertinent Negatives: Patient denies CP, SOB, headache, weakness, numbness Disposition: [] ED /[] Urgent Care (no appt availability in office) / [x] Appointment(In office/virtual)/ []  [facility]/ [] Home Care/ [] Refused Recommended Disposition /[] [facility]/ []  Follow-up with PCP Additional Notes: patient was transferred to Nurse Triage and stated that all she needed was to get an appointment. Patient wouldn't give anymore detail at first. Patient was questions about the symptoms she told the agent. Patient states her mouth feels funny because she hasn't eaten anything yet today. Questioned if patient was having abdominal pain, nausea, vomiting-patient denied. Multiple attempts were made to try and get more information but patient was unwilling to give more details. Eventually patient states that she was having bilateral knee pain and wanted to see the provider next week.  An appointment time was discussed, May 31, 2023 at 1:30 PM with PCP. Patient agreed to the time and then hung up.    Copied from CRM [id number]. Topic: Clinical - Red Word Triage >> May 27, 2023  3:59 PM Loreda Rodriguez T wrote: Red Word that prompted transfer to Nurse Triage: patient called stated her mouth feels funny and her face is a little twisted up. Reason for Disposition  [1] MILD pain (e.g., does not interfere with normal activities) AND [2] present > 7 days  Answer Assessment - Initial Assessment Questions 1. LOCATION and RADIATION: "Where is the pain located?"      Bilateral knees-one is more painful than the other. Patient refused to give any more detail than that.  Protocols used: Knee Pain-A-AH

## 2023-05-31 ENCOUNTER — Ambulatory Visit (INDEPENDENT_AMBULATORY_CARE_PROVIDER_SITE_OTHER): Admitting: Medical

## 2023-05-31 VITALS — Temp 98.4°F | Wt 149.6 lb

## 2023-05-31 DIAGNOSIS — H93A9 Pulsatile tinnitus, unspecified ear: Secondary | ICD-10-CM | POA: Diagnosis not present

## 2023-05-31 DIAGNOSIS — R519 Headache, unspecified: Secondary | ICD-10-CM | POA: Diagnosis not present

## 2023-05-31 DIAGNOSIS — F430 Acute stress reaction: Secondary | ICD-10-CM

## 2023-05-31 DIAGNOSIS — R200 Anesthesia of skin: Secondary | ICD-10-CM | POA: Diagnosis not present

## 2023-05-31 DIAGNOSIS — R7301 Impaired fasting glucose: Secondary | ICD-10-CM | POA: Diagnosis not present

## 2023-05-31 NOTE — Patient Instructions (Signed)
 You have a lot of different symptoms today.  You are also under more stress of late with your family member's health concern  We will check some labs today.  Your blood pressures did not drop suggesting low volume or dehydration.  Your blood pressures are okay.  You have a low pulse rate but it seems stable around 56 to 60 bpm  Symptoms could be related to stress, inner ear fluid behind your eardrum, abnormal heart rhythm, or other.  Pending labs I may need to set you up for head imaging team to check your blood flow going to your brain.  I am also checking for vitamin deficiency or electrolyte disturbance as well  Your exam today does not suggest stroke.   You do not mention any palpitations today but if you start getting any type of weird beats of your heart we may need to check your heart rhythm with a multi day monitor.  You have possibly some fluid behind the eardrums.  Consider doing Flonase nasal spray and/or allergy pill such as Zyrtec at night for the next 1-2 weeks to see if this helps at all.

## 2023-05-31 NOTE — Progress Notes (Signed)
 Subjective:  Rachel [name] is a 73 y.o. female who presents for Chief Complaint  Patient presents with   Acute Visit    Head, tongue trembling, and left pain. It started Friday 5/9 stopped on Sunday and started back this AM. She states that she might be under stress.      Here for several symptoms.  In the past week she has had several symptoms that concern her.  She notes left-sided headache over the weekend that radiated to the back of her neck and persisted for longer than normal headaches that she would have.  She denies history of chronic headaches but this was new last week.  She has had a blood pressure pulsing sensation in her ears.  She notes her tongue has felt numb periodically.  She finds herself sucking on the end of her tongue and sucking on her lips.  Her left leg also feels tingly at times or sometimes hurts.  She denies recent injury or trauma.  No confusion.  No slurred speech.  In the past years ago she had an event where they thought she had a mini stroke and had been sent to the emergency department for evaluation  She denies history of stroke.  No recent chest pain, shortness of breath or palpitation.  No edema.  No syncope.  She is compliant with her medicines.    She checks her blood pressures at home somewhat regularly or using normal.  She has been under more stress lately.  Sister in law stopped talking, going into dementia.  So she has been going back and forth to go to her that house to help and to relieve the caregivers coming in of late.   No other aggravating or relieving factors.    No other c/o.  Past Medical History:  Diagnosis Date   Arthritis    knees   Bell's palsy    Cataract    removed both   Gastroesophageal reflux disease 02/17/2015   Herpes    Hypertension    Current Outpatient Medications on File Prior to Visit  Medication Sig Dispense Refill   acyclovir  (ZOVIRAX ) 400 MG tablet TAKE 1 TABLET BY MOUTH TWICE A DAY 180 tablet 5    amLODipine  (NORVASC ) 10 MG tablet TAKE 1 TABLET BY MOUTH EVERY DAY 90 tablet 0   aspirin EC 81 MG tablet Take 81 mg by mouth daily. Swallow whole.     Cholecalciferol (VITAMIN D ) 50 MCG (2000 UT) CAPS Take 1 capsule (2,000 Units total) by mouth daily. 90 capsule 0   zinc gluconate 50 MG tablet Take 50 mg by mouth daily.     albuterol  (VENTOLIN  HFA) 108 (90 Base) MCG/ACT inhaler Inhale 2 puffs into the lungs every 6 (six) hours as needed for wheezing or shortness of breath. (Patient not taking: Reported on 05/31/2023) 18 g 0   lidocaine  (LIDODERM ) 5 % Place 1 patch onto the skin every 12 (twelve) hours. Remove & Discard patch within 12 hours or as directed by MD (Patient not taking: Reported on 05/31/2023) 10 patch 0   Naproxen  375 MG TBEC Take 1 tablet (375 mg total) by mouth in the morning and at bedtime. (Patient not taking: Reported on 05/31/2023) 14 tablet 0   tiZANidine  (ZANAFLEX ) 4 MG tablet Take 1 tablet (4 mg total) by mouth 2 (two) times daily as needed for muscle spasms. (Patient not taking: Reported on 05/31/2023) 20 tablet 0   No current facility-administered medications on file prior to visit.  The following portions of the patient's history were reviewed and updated as appropriate: allergies, current medications, past family history, past medical history, past social history, past surgical history and problem list.  ROS Otherwise as in subjective above    Objective: Temp 98.4 F (36.9 C)   Wt 149 lb 9.6 oz (67.9 kg)   LMP 08/01/2012   SpO2 98%   BMI 27.36 kg/m   BP Readings from Last 3 Encounters:  02/15/23 110/70  12/21/22 128/70  12/14/22 118/60   Wt Readings from Last 3 Encounters:  05/31/23 149 lb 9.6 oz (67.9 kg)  02/15/23 146 lb 6.4 oz (66.4 kg)  12/21/22 146 lb 3.2 oz (66.3 kg)    General appearance: alert, no distress, well developed, well nourished HEENT: normocephalic, sclerae anicteric, conjunctiva pink and moist, TMs pearly, nares patent, no discharge  or erythema, pharynx normal Oral cavity: MMM, no lesions Neck: supple, no lymphadenopathy, no thyromegaly, no masses, no bruits Heart: RRR, normal S1, S2, no murmurs Lungs: CTA bilaterally, no wheezes, rhonchi, or rales Pulses: 2+ radial pulses, 2+ pedal pulses, normal cap refill Ext: no edema Neuro: CN II through XII intact, no obvious facial asymmetry, Romberg negative, finger-to-nose normal, nonfocal exam Psych: Pleasant, answers question appropriate, good eye contact   Assessment: Encounter Diagnoses  Name Primary?   Nonintractable headache, unspecified chronicity pattern, unspecified headache type Yes   Numbness of tongue    Pulsatile tinnitus    Acute stress reaction    Leg numbness    Impaired fasting blood sugar      Plan: You have a lot of different symptoms today.  You are also under more stress of late with your family member's health concern  We will check some labs today.  Your blood pressures did not drop suggesting low volume or dehydration.  Your blood pressures are okay.  You have a low pulse rate but it seems stable around 56 to 60 bpm  Symptoms could be related to stress, inner ear fluid behind your eardrum, abnormal heart rhythm, or other.  Pending labs I may need to set you up for head imaging team to check your blood flow going to your brain.  Consider MR angiogram of head and neck  I am also checking for vitamin deficiency or electrolyte disturbance as well  Your exam today does not suggest stroke.   You do not mention any palpitations today but if you start getting any type of weird beats of your heart we may need to check your heart rhythm with a multi day monitor.  You have possibly some fluid behind the eardrums.  Consider doing Flonase nasal spray and/or allergy pill such as Zyrtec at night for the next 1-2 weeks to see if this helps at all.   Myeisha was seen today for acute visit.  Diagnoses and all orders for this visit:  Nonintractable  headache, unspecified chronicity pattern, unspecified headache type -     Basic metabolic panel with GFR -     CBC -     Hemoglobin A1c -     Vitamin B12 -     TSH + free T4 -     Orthostatic vital signs  Numbness of tongue -     Basic metabolic panel with GFR -     CBC -     Hemoglobin A1c -     Vitamin B12 -     TSH + free T4 -     Orthostatic vital signs  Pulsatile  tinnitus -     Basic metabolic panel with GFR -     CBC -     Hemoglobin A1c -     Vitamin B12 -     TSH + free T4 -     Orthostatic vital signs  Acute stress reaction -     Basic metabolic panel with GFR -     CBC -     Hemoglobin A1c -     Vitamin B12 -     TSH + free T4 -     Orthostatic vital signs  Leg numbness -     Basic metabolic panel with GFR -     CBC -     Hemoglobin A1c -     Vitamin B12 -     TSH + free T4 -     Orthostatic vital signs  Impaired fasting blood sugar -     Basic metabolic panel with GFR -     CBC -     Hemoglobin A1c -     Vitamin B12 -     TSH + free T4 -     Orthostatic vital signs    Follow up: pending labs

## 2023-06-01 ENCOUNTER — Ambulatory Visit: Payer: Self-pay | Admitting: Medical

## 2023-06-01 LAB — CBC
Hematocrit: 37.7 % (ref 34.0–46.6)
Hemoglobin: 11.9 g/dL (ref 11.1–15.9)
MCH: 27.5 pg (ref 26.6–33.0)
MCHC: 31.6 g/dL (ref 31.5–35.7)
MCV: 87 fL (ref 79–97)
Platelets: 230 10*3/uL (ref 150–450)
RBC: 4.33 x10E6/uL (ref 3.77–5.28)
RDW: 13.1 % (ref 11.7–15.4)
WBC: 6.7 10*3/uL (ref 3.4–10.8)

## 2023-06-01 LAB — TSH+FREE T4
Free T4: 1.04 ng/dL (ref 0.82–1.77)
TSH: 1.22 u[IU]/mL (ref 0.450–4.500)

## 2023-06-01 LAB — BASIC METABOLIC PANEL WITH GFR
BUN/Creatinine Ratio: 22 (ref 12–28)
BUN: 14 mg/dL (ref 8–27)
CO2: 22 mmol/L (ref 20–29)
Calcium: 9.9 mg/dL (ref 8.7–10.3)
Chloride: 104 mmol/L (ref 96–106)
Creatinine, Ser: 0.65 mg/dL (ref 0.57–1.00)
Glucose: 91 mg/dL (ref 70–99)
Potassium: 4.5 mmol/L (ref 3.5–5.2)
Sodium: 140 mmol/L (ref 134–144)
eGFR: 93 mL/min/{1.73_m2} (ref >59)

## 2023-06-01 LAB — HEMOGLOBIN A1C
Est. average glucose Bld gHb Est-mCnc: 137 mg/dL
Hgb A1c MFr Bld: 6.4 % — ABNORMAL HIGH (ref 4.8–5.6)

## 2023-06-01 LAB — VITAMIN B12: Vitamin B-12: 573 pg/mL (ref 232–1245)

## 2023-06-01 NOTE — Progress Notes (Signed)
 Labs show normal thyroid, normal blood count, normal electrolytes and kidney, normal B12.  Diabetes marker at risk for diabetes but similar to last check.    Yesterday, I mentioned possibly moving forward to do MR angiogram blood flow study of the head and neck if the labs were normal given your symptoms and concerns.  If agreeable I will put an order for MR imaging.  Do you have any metal in your body?  Begin Flonase nasal spray and/or allergy pill such as Zyrtec at night for the next 2 weeks to see if that helps some of the symptoms given that you had some fluid behind your eardrums.

## 2023-08-01 ENCOUNTER — Other Ambulatory Visit: Payer: Self-pay | Admitting: Medical

## 2023-08-02 ENCOUNTER — Ambulatory Visit (INDEPENDENT_AMBULATORY_CARE_PROVIDER_SITE_OTHER)

## 2023-08-02 ENCOUNTER — Encounter (HOSPITAL_COMMUNITY): Payer: Self-pay

## 2023-08-02 ENCOUNTER — Ambulatory Visit (HOSPITAL_COMMUNITY): Admission: EM | Admit: 2023-08-02 | Discharge: 2023-08-02 | Disposition: A | Discharge status: Home / Self Care

## 2023-08-02 DIAGNOSIS — R1011 Right upper quadrant pain: Secondary | ICD-10-CM | POA: Diagnosis not present

## 2023-08-02 DIAGNOSIS — R1012 Left upper quadrant pain: Secondary | ICD-10-CM

## 2023-08-02 DIAGNOSIS — R1013 Epigastric pain: Secondary | ICD-10-CM | POA: Diagnosis not present

## 2023-08-02 DIAGNOSIS — R140 Abdominal distension (gaseous): Secondary | ICD-10-CM

## 2023-08-02 DIAGNOSIS — K529 Noninfective gastroenteritis and colitis, unspecified: Secondary | ICD-10-CM

## 2023-08-02 MED ORDER — ALUM & MAG HYDROXIDE-SIMETH 200-200-20 MG/5ML PO SUSP
ORAL | Status: AC
  Filled 2023-08-02: qty 30

## 2023-08-02 MED ORDER — ONDANSETRON 4 MG PO TBDP: 4.0000 mg | ORAL_TABLET | Freq: Three times a day (TID) | ORAL | 0 refills | Status: DC | PRN

## 2023-08-02 MED ORDER — ALUM & MAG HYDROXIDE-SIMETH 200-200-20 MG/5ML PO SUSP
30.0000 mL | Freq: Once | ORAL | Status: AC
  Administered 2023-08-02: 30 mL via ORAL

## 2023-08-02 MED ORDER — LIDOCAINE VISCOUS HCL 2 % MT SOLN
OROMUCOSAL | Status: AC
  Filled 2023-08-02: qty 15

## 2023-08-02 MED ORDER — LIDOCAINE VISCOUS HCL 2 % MT SOLN
15.0000 mL | Freq: Once | OROMUCOSAL | Status: AC
  Administered 2023-08-02: 15 mL via OROMUCOSAL

## 2023-08-02 NOTE — ED Provider Notes (Signed)
 [facility]    CSN: [id number] Arrival date & time: 08/02/23  0945      History   Chief Complaint Chief Complaint  Patient presents with   Abdominal Pain    HPI Rachel [name] is a 73 y.o. female.   73 year old female who reports that she ate a variety of food last night and then later felt really bad after supper.  She had upper abdominal pain, indigestion, bloating and gas with some burping.  She denies nausea, vomiting, constipation, diarrhea.  She tried some sodium bicarb and water.  She tried some, vinegar juice.  She tried Prilosec OTC.  No relief from any of those.  This morning she tried at different vinegar and no relief.  She had a moderate bowel movement not long after she woke up this morning.  For 15 to 30 minutes.  Her pain was much better after the bowel movement.  Last night her pain was 8 or more out of 10.  Today her pain is 4 or 5 out of 10.  She denies fever.   Abdominal Pain Associated symptoms: no chest pain, no chills, no constipation, no cough, no diarrhea, no dysuria, no fever, no hematuria, no nausea, no shortness of breath, no sore throat and no vomiting     Past Medical History:  Diagnosis Date   Arthritis    knees   Bell's palsy    Cataract    removed both   Gastroesophageal reflux disease 02/17/2015   Herpes    Hypertension     Patient Active Problem List   Diagnosis Date Noted   Encounter for health maintenance examination in adult 11/04/2021   Screening for hematuria or proteinuria 11/04/2021   Vaccine counseling 11/04/2021   Influenza vaccination declined 11/04/2021   Nonintractable headache 11/04/2021   GERD without esophagitis 04/09/2021   Prediabetes 05/13/2020   Seasonal allergic rhinitis due to pollen 05/30/2019   Genital herpes simplex 01/05/2019   Hormone replacement therapy (postmenopausal) 09/07/2016   Essential hypertension 11/30/2012    Past Surgical History:  Procedure Laterality Date   ABDOMINAL  HYSTERECTOMY     COLONOSCOPY  07/2021   POLYPECTOMY      OB History   No obstetric history on file.      Home Medications    Prior to Admission medications   Medication Sig Start Date End Date Taking? Authorizing Provider  ondansetron  (ZOFRAN -ODT) 4 MG disintegrating tablet Take 1 tablet (4 mg total) by mouth every 8 (eight) hours as needed for nausea or vomiting. 08/02/23  Yes Ival Domino, FNP  acyclovir  (ZOVIRAX ) 400 MG tablet TAKE 1 TABLET BY MOUTH TWICE A DAY 03/15/23   Tysinger, Alm RAMAN, PA-C  albuterol  (VENTOLIN  HFA) 108 (90 Base) MCG/ACT inhaler Inhale 2 puffs into the lungs every 6 (six) hours as needed for wheezing or shortness of breath. Patient not taking: Reported on 05/31/2023 12/14/22   Bulah Alm RAMAN, PA-C  amLODipine  (NORVASC ) 10 MG tablet TAKE 1 TABLET BY MOUTH EVERY DAY 08/01/23   Tysinger, Alm RAMAN, PA-C  aspirin EC 81 MG tablet Take 81 mg by mouth daily. Swallow whole.    [provider]  Cholecalciferol (VITAMIN D ) 50 MCG (2000 UT) CAPS Take 1 capsule (2,000 Units total) by mouth daily. 11/16/22   Tysinger, Alm RAMAN, PA-C  omeprazole  (PRILOSEC OTC) 20 MG tablet Take 20 mg by mouth daily.    [provider]  zinc gluconate 50 MG tablet Take 50 mg by mouth daily.  [provider]    Family History Family History  Problem Relation Age of Onset   Stroke Mother    Heart disease Mother    Prostate cancer Father    Heart disease Father    Cancer Father        prostate cancer   Diabetes Brother    Heart disease Brother    Colon cancer Maternal Grandmother        pt unsure, MGM might have had colon CA   Cancer Maternal Grandfather        ? type of cancer   Esophageal cancer Neg Hx    Stomach cancer Neg Hx    Rectal cancer Neg Hx    Colon polyps Neg Hx     Social History Social History   Tobacco Use   Smoking status: Never   Smokeless tobacco: Never  Vaping Use   Vaping status: Never Used  Substance Use Topics   Alcohol  use: No   Drug use: No     Allergies   Covid-19 (subunit) vaccine   Review of Systems Review of Systems  Constitutional:  Negative for chills and fever.  HENT:  Negative for ear pain and sore throat.   Eyes:  Negative for pain and visual disturbance.  Respiratory:  Negative for cough and shortness of breath.   Cardiovascular:  Negative for chest pain and palpitations.  Gastrointestinal:  Positive for abdominal distention and abdominal pain. Negative for constipation, diarrhea, nausea and vomiting.  Genitourinary:  Negative for dysuria and hematuria.  Musculoskeletal:  Negative for arthralgias and back pain.  Skin:  Negative for color change and rash.  Neurological:  Negative for seizures and syncope.  All other systems reviewed and are negative.    Physical Exam Triage Vital Signs ED Triage Vitals  Encounter Vitals Group     BP 08/02/23 1039 138/64     Girls Systolic BP Percentile --      Girls Diastolic BP Percentile --      Boys Systolic BP Percentile --      Boys Diastolic BP Percentile --      Pulse Rate 08/02/23 1039 61     Resp 08/02/23 1039 16     Temp 08/02/23 1039 98 F (36.7 C)     Temp Source 08/02/23 1039 Oral     SpO2 08/02/23 1039 97 %     Weight --      Height --      Head Circumference --      Peak Flow --      Pain Score 08/02/23 1040 7     Pain Loc --      Pain Education --      Exclude from Growth Chart --    No data found.  Updated Vital Signs BP 138/64 (BP Location: Left Arm)   Pulse 61   Temp 98 F (36.7 C) (Oral)   Resp 16   LMP 08/01/2012   SpO2 97%   Visual Acuity Right Eye Distance:   Left Eye Distance:   Bilateral Distance:    Right Eye Near:   Left Eye Near:    Bilateral Near:     Physical Exam Vitals and nursing note reviewed.  Constitutional:      General: She is not in acute distress.    Appearance: She is well-developed. She is not ill-appearing or toxic-appearing.  HENT:     Head: Normocephalic and atraumatic.      Right Ear: Hearing, tympanic  membrane, ear canal and external ear normal.     Left Ear: Hearing, tympanic membrane, ear canal and external ear normal.     Nose: No congestion or rhinorrhea.     Right Sinus: No maxillary sinus tenderness or frontal sinus tenderness.     Left Sinus: No maxillary sinus tenderness or frontal sinus tenderness.     Mouth/Throat:     Lips: Pink.     Mouth: Mucous membranes are moist.     Pharynx: Uvula midline. No oropharyngeal exudate or posterior oropharyngeal erythema.     Tonsils: No tonsillar exudate.  Eyes:     Conjunctiva/sclera: Conjunctivae normal.     Pupils: Pupils are equal, round, and reactive to light.  Cardiovascular:     Rate and Rhythm: Normal rate and regular rhythm.     Heart sounds: S1 normal and S2 normal. No murmur heard. Pulmonary:     Effort: Pulmonary effort is normal. No respiratory distress.     Breath sounds: Normal breath sounds. No decreased breath sounds, wheezing, rhonchi or rales.  Abdominal:     General: Bowel sounds are normal.     Palpations: Abdomen is soft.     Tenderness: There is abdominal tenderness (Mild to moderate and epigastric pain is the most tender.) in the right upper quadrant, epigastric area and left upper quadrant.  Musculoskeletal:        General: No swelling.     Cervical back: Neck supple.  Lymphadenopathy:     Head:     Right side of head: No submental, submandibular, tonsillar, preauricular or posterior auricular adenopathy.     Left side of head: No submental, submandibular, tonsillar, preauricular or posterior auricular adenopathy.     Cervical: No cervical adenopathy.     Right cervical: No superficial cervical adenopathy.    Left cervical: No superficial cervical adenopathy.  Skin:    General: Skin is warm and dry.     Capillary Refill: Capillary refill takes less than 2 seconds.     Findings: No rash.  Neurological:     Mental Status: She is alert and oriented to person, place, and  time.  Psychiatric:        Mood and Affect: Mood normal.      UC Treatments / Results  Labs (all labs ordered are listed, but only abnormal results are displayed) Labs Reviewed - No data to display  EKG   Radiology DG Abd 1 View Result Date: 08/02/2023 CLINICAL DATA:  Abdominal pain with indigestion and gas since last night. EXAM: ABDOMEN - 1 VIEW COMPARISON:  Abdominal radiographs 10/11/2015. FINDINGS: 1204 hours. Single supine view of the abdomen demonstrates a normal nonobstructive bowel gas pattern. There is no supine evidence of pneumoperitoneum. No suspicious abdominal calcifications are identified. Small pelvic calcifications are likely phleboliths. There are degenerative changes in the spine associated with a convex left thoracolumbar scoliosis. IMPRESSION: No evidence of bowel obstruction or other acute abdominal process. Thoracolumbar scoliosis and degenerative changes. Electronically Signed   By: Elsie Perone M.D.   On: 08/02/2023 12:42    Procedures Procedures (including critical care time)  Medications Ordered in UC Medications  alum & mag hydroxide-simeth (MAALOX/MYLANTA) 200-200-20 MG/5ML suspension 30 mL (30 mLs Oral Given 08/02/23 1142)  lidocaine  (XYLOCAINE ) 2 % viscous mouth solution 15 mL (15 mLs Mouth/Throat Given 08/02/23 1142)    Initial Impression / Assessment and Plan / UC Course  I have reviewed the triage vital signs and the nursing notes.  Pertinent labs &  imaging results that were available during my care of the patient were reviewed by me and considered in my medical decision making (see chart for details).  Plan of Care: Epigastric and upper abdominal pain: GI cocktail given.  KUB shows some moderate gas in the colon and a small amount of stool.  I believe she needs 2 days of bowel rest.  The GI cocktail helped some but did not fully resolve her symptoms.  Encouraged clear liquids for 2 days.  Provided ondansetron , 4 mg ODT every 8 hours if needed  for nausea and vomiting.  If symptoms do not improve, worsen or new symptoms occur, see primary care, return here or go to an emergency room.  She may need a gallbladder ultrasound, a CT scan or additional blood work.  I reviewed the plan of care with the patient and/or the patient's guardian.  The patient and/or guardian had time to ask questions and acknowledged that the questions were answered.  I provided instruction on symptoms or reasons to return here or to go to an ER, if symptoms/condition did not improve, worsened or if new symptoms occurred.  Final Clinical Impressions(s) / UC Diagnoses   Final diagnoses:  Epigastric pain  Left upper quadrant abdominal pain  Right upper quadrant abdominal pain  Abdominal bloating  Gastroenteritis     Discharge Instructions      Abdominal pain, bloating and gastroenteritis: Some relief of pain with GI cocktail.  Abdominal x-ray shows moderate amount of gas in the colon in the small intestine.  Encouraged clear liquids and bowel rest for 1 to 3 days.  May need a mild laxative to get empty of stool.    Follow-up here, and emergency room or with primary care if symptoms persist.  She may need a CT scan, gallbladder ultrasound or lab work.     ED Prescriptions     Medication Sig Dispense Auth. Provider   ondansetron  (ZOFRAN -ODT) 4 MG disintegrating tablet Take 1 tablet (4 mg total) by mouth every 8 (eight) hours as needed for nausea or vomiting. 20 tablet Denzal Meir, FNP      PDMP not reviewed this encounter.   Ival Domino, FNP 08/02/23 1250

## 2023-08-02 NOTE — Discharge Instructions (Addendum)
 Abdominal pain, bloating and gastroenteritis: Some relief of pain with GI cocktail.  Abdominal x-ray shows moderate amount of gas in the colon in the small intestine.  Encouraged clear liquids and bowel rest for 1 to 3 days.  May need a mild laxative to get empty of stool.    Follow-up here, and emergency room or with primary care if symptoms persist.  She may need a CT scan, gallbladder ultrasound or lab work.

## 2023-08-02 NOTE — ED Triage Notes (Signed)
 Patient here today with c/o abd pain, indigestion, and gas since last night. Patient tried taking Prilosec with no relief. Patient states that she did have a BM this morning.

## 2023-08-04 ENCOUNTER — Ambulatory Visit (HOSPITAL_BASED_OUTPATIENT_CLINIC_OR_DEPARTMENT_OTHER): Payer: Self-pay | Admitting: Family Medicine

## 2023-08-04 ENCOUNTER — Ambulatory Visit (INDEPENDENT_AMBULATORY_CARE_PROVIDER_SITE_OTHER): Admitting: Medical

## 2023-08-04 VITALS — BP 110/68 | HR 71 | Temp 97.9°F | Wt 145.4 lb

## 2023-08-04 DIAGNOSIS — K5900 Constipation, unspecified: Secondary | ICD-10-CM

## 2023-08-04 DIAGNOSIS — M25552 Pain in left hip: Secondary | ICD-10-CM

## 2023-08-04 DIAGNOSIS — R109 Unspecified abdominal pain: Secondary | ICD-10-CM

## 2023-08-04 DIAGNOSIS — R140 Abdominal distension (gaseous): Secondary | ICD-10-CM | POA: Diagnosis not present

## 2023-08-04 DIAGNOSIS — M2560 Stiffness of unspecified joint, not elsewhere classified: Secondary | ICD-10-CM

## 2023-08-04 MED ORDER — OMEPRAZOLE 40 MG PO CPDR: 40.0000 mg | DELAYED_RELEASE_CAPSULE | Freq: Every day | ORAL | 0 refills | Status: DC

## 2023-08-04 MED ORDER — POLYETHYLENE GLYCOL 3350 17 GM/SCOOP PO POWD: 17.0000 g | Freq: Two times a day (BID) | ORAL | 0 refills | Status: DC | PRN

## 2023-08-04 NOTE — Progress Notes (Addendum)
 Subjective:  Rachel [name] is a 73 y.o. female who presents for Chief Complaint  Patient presents with   Hospitalization Follow-up    Urgent Care follow-up. Still having some pain but was pushing mower this morning and felt some pain.      Here for follow-up from urgent care visit.  She went to urgent care 08/02/2023 for upper abdominal pain, indigestion, bloating and gas/GI cocktail.  She had a KUB x-ray reportedly showing some gas in the colon.  She was advised 2 days of bowel rest, clear liquids, Zofran  as needed.  She feels some mild abdominal discomfort today, hungry.  Ate some soup yesterday, some bread.    No current nausea and vomiting.  Gas has improved as well  Had BM yesterday morning a little.    She gets little bit discomfort in the left lower abdomen in the night.  She also gets pain in her left leg.  She had a fall sometime in the last year on Friday.  She does exercise regularly.  No fever, no blood in the stool, overall improving from 2 days ago  No other aggravating or relieving factors.    No other c/o.  Past Medical History:  Diagnosis Date   Arthritis    knees   Bell's palsy    Cataract    removed both   Gastroesophageal reflux disease 02/17/2015   Herpes    Hypertension    Current Outpatient Medications on File Prior to Visit  Medication Sig Dispense Refill   acyclovir  (ZOVIRAX ) 400 MG tablet TAKE 1 TABLET BY MOUTH TWICE A DAY 180 tablet 5   albuterol  (VENTOLIN  HFA) 108 (90 Base) MCG/ACT inhaler Inhale 2 puffs into the lungs every 6 (six) hours as needed for wheezing or shortness of breath. 18 g 0   amLODipine  (NORVASC ) 10 MG tablet TAKE 1 TABLET BY MOUTH EVERY DAY 90 tablet 1   aspirin EC 81 MG tablet Take 81 mg by mouth daily. Swallow whole.     Cholecalciferol (VITAMIN D ) 50 MCG (2000 UT) CAPS Take 1 capsule (2,000 Units total) by mouth daily. 90 capsule 0   omeprazole  (PRILOSEC OTC) 20 MG tablet Take 20 mg by mouth daily.     zinc gluconate  50 MG tablet Take 50 mg by mouth daily.     ondansetron  (ZOFRAN -ODT) 4 MG disintegrating tablet Take 1 tablet (4 mg total) by mouth every 8 (eight) hours as needed for nausea or vomiting. (Patient not taking: Reported on 08/04/2023) 20 tablet 0   No current facility-administered medications on file prior to visit.     The following portions of the patient's history were reviewed and updated as appropriate: allergies, current medications, past family history, past medical history, past social history, past surgical history and problem list.  ROS Otherwise as in subjective above   Objective: BP 110/68   Pulse 71   Temp 97.9 F (36.6 C)   Wt 145 lb 6.4 oz (66 kg)   LMP 08/01/2012   SpO2 98%   BMI 26.59 kg/m  Wt Readings from Last 3 Encounters:  08/04/23 145 lb 6.4 oz (66 kg)  05/31/23 149 lb 9.6 oz (67.9 kg)  02/15/23 146 lb 6.4 oz (66.4 kg)    General appearance: alert, no distress, well developed, well nourished Abdomen: +bs, soft, mild tenderness left lower quadrant and right upper quadrant, otherwise non tender, non distended, no masses, no hepatomegaly, no splenomegaly Pulses: 2+ radial pulses, 2+ pedal pulses, normal cap refill Ext:  no edema MSK: Left hip with significantly decreased range of motion compared to the right.  Otherwise legs nontender without other deformity or swelling    Assessment: Encounter Diagnoses  Name Primary?   Left hip pain Yes   Limited joint range of motion    Bloating    Abdominal discomfort    Constipation, unspecified constipation type      Plan: I reviewed her recent urgent care visit notes and KUB x-ray from 08/02/2023  Abdominal discomfort and bloating He report being much improved compared to your urgent care visit 2 days ago Continue Zofran  ondansetron  as needed for nausea Continue your daily omeprazole  Prilosec.  However given your recent symptoms I would recommend doing a stronger dose 40 mg omeprazole  for the next month.  I  will send this to your pharmacy I recommend you use Tums as needed for indigestion or upset stomach You also may want to consider taking Pepto-Bismol twice daily for the next 7 days to calm down stomach irritation from your recent visit For the next 2 weeks limit or cut back on spicy foods, acidic foods, greasy fried foods, peppers, hot sauce, tomatoes and citrus Avoid gas causing foods or limit those such as cabbage, onions, Brussels sprouts, fried foods, pinto beans, other If you keep getting significant upper belly pain over the next 2 weeks despite these changes above we may need to have you go back to see gastroenterology.  Sometimes ulcers can cause more persistent pain   Left hip pain, decreased range of motion of left hip You have significantly decreased range of motion of your left hip compared to the right I recommend stretches every day to help increase your range of motion and flexibility of the hips and back I recommend referral to physical therapy and an x-ray of your hip.  You declined today If you decide you want to pursue this in the next month then let me know  Constipation-use MiraLAX  short-term once or twice a daily as discussed  At the end of the appointment she was agreeable to physical therapy referral  Sheniah was seen today for hospitalization follow-up.  Diagnoses and all orders for this visit:  Left hip pain -     Ambulatory referral to Physical Therapy  Limited joint range of motion -     Ambulatory referral to Physical Therapy  Bloating  Abdominal discomfort  Constipation, unspecified constipation type  Other orders -     omeprazole  (PRILOSEC) 40 MG capsule; Take 1 capsule (40 mg total) by mouth daily. -     polyethylene glycol powder (GLYCOLAX /MIRALAX ) 17 GM/SCOOP powder; Take 17 g by mouth 2 (two) times daily as needed.    Follow up: 1 month

## 2023-08-04 NOTE — Progress Notes (Signed)
 Negative Abdominal X-Ray.  Patient was updated during his visit.

## 2023-08-04 NOTE — Addendum Note (Signed)
 Addended by: BULAH ALM RAMAN on: 08/04/2023 11:58 AM   Modules accepted: Orders

## 2023-08-04 NOTE — Patient Instructions (Signed)
  Abdominal discomfort and bloating He report being much improved compared to your urgent care visit 2 days ago Continue Zofran  ondansetron  as needed for nausea Continue your daily omeprazole  Prilosec.  However given your recent symptoms I would recommend doing a stronger dose 40 mg omeprazole  for the next month.  I will send this to your pharmacy I recommend you use Tums as needed for indigestion or upset stomach You also may want to consider taking Pepto-Bismol twice daily for the next 7 days to calm down stomach irritation from your recent visit For the next 2 weeks limit or cut back on spicy foods, acidic foods, greasy fried foods, peppers, hot sauce, tomatoes and citrus Avoid gas causing foods or limit those such as cabbage, onions, Brussels sprouts, fried foods, pinto beans, other If you keep getting significant upper belly pain over the next 2 weeks despite these changes above we may need to have you go back to see gastroenterology.  Sometimes ulcers can cause more persistent pain   Left hip pain, decreased range of motion of left hip You have significantly decreased range of motion of your left hip compared to the right I recommend stretches every day to help increase your range of motion and flexibility of the hips and back I recommend referral to physical therapy and an x-ray of your hip.  You declined today If you decide you want to pursue this in the next month then let me know

## 2023-08-26 ENCOUNTER — Other Ambulatory Visit: Payer: Self-pay | Admitting: Medical

## 2023-08-31 ENCOUNTER — Ambulatory Visit: Payer: Self-pay

## 2023-08-31 NOTE — Telephone Encounter (Signed)
 FYI Only or Action Required?: FYI only for provider.  Patient was last seen in primary care on 08/04/2023 by Bulah Alm RAMAN, PA-C.  Called Nurse Triage reporting Neurologic Problem.  Symptoms began a week ago.  Interventions attempted: Nothing.  Symptoms are: unchanged.  Triage Disposition: See PCP When Office is Open (Within 3 Days)  Patient/caregiver understands and will follow disposition?: Yes      Copied from CRM [id number]. Topic: Clinical - Red Word Triage >> Aug 31, 2023  2:28 PM Charlet HERO wrote: Red Word that prompted transfer to Nurse Triage: patient is stating that left leg and foot get numb and feels strange also has pain under her left breast deep pinch she states it feels like it is under her rib. Reason for Disposition  [1] Numbness or tingling on both sides of body AND [2] is a new symptom present > 24 hours  Answer Assessment - Initial Assessment Questions 1. SYMPTOM: What is the main symptom you are concerned about? (e.g., weakness, numbness)     Numbness in left leg and foot,  2. ONSET: When did this start? (e.g., minutes, hours, days; while sleeping)     Thinks maybe Friday 3. LAST NORMAL: When was the last time you (the patient) were normal (no symptoms)?     On  4. PATTERN Does this come and go, or has it been constant since it started?  Is it present now?     Comes and goes 5. CARDIAC SYMPTOMS: Have you had any of the following symptoms: chest pain, difficulty breathing, palpitations?     none 6. NEUROLOGIC SYMPTOMS: Have you had any of the following symptoms: headache, dizziness, vision loss, double vision, changes in speech, unsteady on your feet?     no 7. OTHER SYMPTOMS: Do you have any other symptoms?     States that twice when turning and putting stuff back in closet she felt like a pinch under her left breast/  Protocols used: Neurologic Deficit-A-AH

## 2023-09-01 ENCOUNTER — Ambulatory Visit
Admission: RE | Admit: 2023-09-01 | Discharge: 2023-09-01 | Disposition: A | Discharge status: Home / Self Care | Source: Ambulatory Visit | Attending: Medical | Admitting: Medical

## 2023-09-01 ENCOUNTER — Ambulatory Visit (INDEPENDENT_AMBULATORY_CARE_PROVIDER_SITE_OTHER): Admitting: Medical

## 2023-09-01 VITALS — BP 120/70 | HR 59 | Temp 97.7°F | Wt 149.2 lb

## 2023-09-01 DIAGNOSIS — M5442 Lumbago with sciatica, left side: Secondary | ICD-10-CM | POA: Diagnosis not present

## 2023-09-01 DIAGNOSIS — M4316 Spondylolisthesis, lumbar region: Secondary | ICD-10-CM | POA: Diagnosis not present

## 2023-09-01 DIAGNOSIS — R200 Anesthesia of skin: Secondary | ICD-10-CM | POA: Diagnosis not present

## 2023-09-01 DIAGNOSIS — R0789 Other chest pain: Secondary | ICD-10-CM | POA: Diagnosis not present

## 2023-09-01 DIAGNOSIS — M5134 Other intervertebral disc degeneration, thoracic region: Secondary | ICD-10-CM | POA: Diagnosis not present

## 2023-09-01 DIAGNOSIS — M5136 Other intervertebral disc degeneration, lumbar region with discogenic back pain only: Secondary | ICD-10-CM | POA: Diagnosis not present

## 2023-09-01 DIAGNOSIS — M25652 Stiffness of left hip, not elsewhere classified: Secondary | ICD-10-CM | POA: Diagnosis not present

## 2023-09-01 DIAGNOSIS — M25552 Pain in left hip: Secondary | ICD-10-CM

## 2023-09-01 MED ORDER — NAPROXEN 375 MG PO TBEC: 1.0000 | DELAYED_RELEASE_TABLET | Freq: Two times a day (BID) | ORAL | 0 refills | Status: DC | PRN

## 2023-09-01 NOTE — Progress Notes (Signed)
 Subjective:  Rachel [name] is a 73 y.o. female who presents for Chief Complaint  Patient presents with   Acute Visit    Numbness in feet. Has some pain in hip and knee     Here for left foot numbness, left leg pain.  She has had left leg pain on and off but the leg and foot numbness started last week.    She did have a recent fall.  She was using electric weed eater recently in past 1.5 week, tripped over the cord and fell.  Fell on left side, got back and up and finished her work.   Denies pain that night and next day, so didn't dwell on the fall.  However she did not feel like the fall caused any of her current pain.  She notes feet have been feeling numb, usually worse of left but sometimes right foot.  She was walking more last week on vacation at the beach.  She does note some left low back pain.  She has decreased range of motion of left hip and hip pain.  I saw her a month ago for hip pain and refer her to physical therapy but she has not started this yet.  She is not sure if she was going to go through with this  Never smoker.  Does not drink alcohol.  No history of iron deficiency.  She has an ongoing intermittent pain in the left anterior ribs unchanged.  No bruising.  No breast lump.  She goes for mammogram soon.  [facility]  No other aggravating or relieving factors.    No other c/o.  Past Medical History:  Diagnosis Date   Arthritis    knees   Bell's palsy    Cataract    removed both   Gastroesophageal reflux disease 02/17/2015   Herpes    Hypertension    Current Outpatient Medications on File Prior to Visit  Medication Sig Dispense Refill   acyclovir  (ZOVIRAX ) 400 MG tablet TAKE 1 TABLET BY MOUTH TWICE A DAY 180 tablet 5   albuterol  (VENTOLIN  HFA) 108 (90 Base) MCG/ACT inhaler Inhale 2 puffs into the lungs every 6 (six) hours as needed for wheezing or shortness of breath. 18 g 0   amLODipine  (NORVASC ) 10 MG tablet TAKE 1 TABLET BY MOUTH  EVERY DAY 90 tablet 1   aspirin EC 81 MG tablet Take 81 mg by mouth daily. Swallow whole.     Multiple Vitamin (MULTIVITAMIN) tablet Take 1 tablet by mouth daily.     omeprazole  (PRILOSEC) 40 MG capsule TAKE 1 CAPSULE (40 MG TOTAL) BY MOUTH DAILY. 90 capsule 1   polyethylene glycol powder (GLYCOLAX /MIRALAX ) 17 GM/SCOOP powder Take 17 g by mouth 2 (two) times daily as needed. 3350 g 0   zinc gluconate 50 MG tablet Take 50 mg by mouth daily.     Cholecalciferol (VITAMIN D ) 50 MCG (2000 UT) CAPS Take 1 capsule (2,000 Units total) by mouth daily. 90 capsule 0   No current facility-administered medications on file prior to visit.     The following portions of the patient's history were reviewed and updated as appropriate: allergies, current medications, past family history, past medical history, past social history, past surgical history and problem list.  ROS Otherwise as in subjective above    Objective: BP 120/70   Pulse (!) 59   Temp 97.7 F (36.5 C)   Wt 149 lb 3.2 oz (67.7 kg)   LMP 08/01/2012  BMI 27.29 kg/m   General appearance: alert, no distress, well developed, well nourished Tender left anterior lower rib, but no bruising, no obvious mass, normal inspiration and expiration abdomen: +bs, soft, nontender, no mass, no organomegaly Back: tender left low back and SI area, flexion to about 70 degrees, mild pain with flexion.  Extension ok. MSK: left hip internal ROM mildly decreased, otherwise normal ROM, legs nontender No LE edema Neuro: Legs normal strength and sensation, normal DTRs 1+ pedal pulses normal cap refill    Assessment: Encounter Diagnoses  Name Primary?   Leg numbness Yes   Left-sided low back pain with left-sided sciatica, unspecified chronicity    Pain of left hip    Decreased range of left hip movement    Chest wall pain      Plan: You have several findings today including left rib tenderness which is chronic, left hip decreased range of motion,  tenderness in the left low back decreased range of motion of back flexion.  Go for x-rays as ordered today of your hip and back  I still recommend you initiate physical therapy.  We have already placed that referral  Stretch daily including back and legs and hips  Begin short-term Naprosyn  prescription for pain and inflammation once or twice a day as needed with food for the next 1 to 2 weeks.  Pending x-rays we may need to pursue other treatment options.  Marvella was seen today for acute visit.  Diagnoses and all orders for this visit:  Leg numbness -     DG Lumbar Spine Complete; Future -     DG Hip Unilat W OR W/O Pelvis 2-3 Views Left; Future  Left-sided low back pain with left-sided sciatica, unspecified chronicity -     DG Thoracic Spine 2 View -     DG Lumbar Spine Complete; Future -     DG Hip Unilat W OR W/O Pelvis 2-3 Views Left; Future  Pain of left hip -     DG Hip Unilat W OR W/O Pelvis 2-3 Views Left; Future  Decreased range of left hip movement -     DG Hip Unilat W OR W/O Pelvis 2-3 Views Left; Future  Chest wall pain -     DG Thoracic Spine 2 View  Other orders -     Naproxen  375 MG TBEC; Take 1 tablet (375 mg total) by mouth 2 (two) times daily as needed.    Follow up: pending xray, PT

## 2023-09-01 NOTE — Patient Instructions (Signed)
 Please go to [facility] for your xrays.   Their hours are 8am - 4:30 pm Monday - Friday.  Take your insurance card with you.  [facility] [phone]   [address] [place], [place] [address]  You have several findings today including left rib tenderness which is chronic, left hip decreased range of motion, tenderness in the left low back decreased range of motion of back flexion.  Go for x-rays as ordered today of your hip and back  I still recommend you initiate physical therapy.  We have already placed that referral  Stretch daily including back and legs and hips  Begin short-term Naprosyn  prescription for pain and inflammation once or twice a day as needed with food for the next 1 to 2 weeks.  Pending x-rays we may need to pursue other treatment options.

## 2023-09-15 ENCOUNTER — Telehealth: Payer: Self-pay | Admitting: Internal Medicine

## 2023-09-15 NOTE — Telephone Encounter (Signed)
 Called and requested them to be read  Copied from CRM #[id number]. Topic: Clinical - Lab/Test Results >> Sep 08, 2023 10:28 AM Myrick T wrote: Reason for CRM: patient called to get her xray results. Advised they had not been released. >> Sep 15, 2023  3:21 PM Antwanette L wrote: Patient is calling back to get her x ray result. Please contact the patient at [phone] when results are ready.

## 2023-09-18 ENCOUNTER — Ambulatory Visit: Payer: Self-pay | Admitting: Medical

## 2023-09-18 NOTE — Progress Notes (Signed)
 Your hip xray shows some severe arthritis and your T and L spine xrays show significant arthritis and degenerative age related bones.   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  Hip and low back pain related to findings on xray.  If agreeable I recommend referral to orthopedics for discussion of results and recommendations.    If agreeable, refer to [facility]ainer

## 2023-09-26 ENCOUNTER — Telehealth: Payer: Self-pay | Admitting: Family Medicine

## 2023-09-26 NOTE — Telephone Encounter (Signed)
 Called pt reached voice mail, lmtrc. Pt needs Welcome to Medicare by 11/125 and due for cpe.  Offer 10/27/23 at 2:15 if still available.

## 2023-09-27 NOTE — Telephone Encounter (Signed)
 The patient called in returning a call to [place]. I called and transferred the call to [facility] for further assistance.

## 2023-10-04 ENCOUNTER — Encounter: Payer: Self-pay | Admitting: Medical

## 2023-10-04 ENCOUNTER — Ambulatory Visit (INDEPENDENT_AMBULATORY_CARE_PROVIDER_SITE_OTHER): Admitting: Medical

## 2023-10-04 VITALS — BP 120/80 | HR 74 | Ht 60.5 in | Wt 152.0 lb

## 2023-10-04 DIAGNOSIS — G8929 Other chronic pain: Secondary | ICD-10-CM | POA: Diagnosis not present

## 2023-10-04 DIAGNOSIS — Z7185 Encounter for immunization safety counseling: Secondary | ICD-10-CM

## 2023-10-04 DIAGNOSIS — R202 Paresthesia of skin: Secondary | ICD-10-CM | POA: Diagnosis not present

## 2023-10-04 DIAGNOSIS — M545 Low back pain, unspecified: Secondary | ICD-10-CM | POA: Diagnosis not present

## 2023-10-04 DIAGNOSIS — R200 Anesthesia of skin: Secondary | ICD-10-CM | POA: Diagnosis not present

## 2023-10-04 DIAGNOSIS — Z13220 Encounter for screening for lipoid disorders: Secondary | ICD-10-CM | POA: Diagnosis not present

## 2023-10-04 DIAGNOSIS — K219 Gastro-esophageal reflux disease without esophagitis: Secondary | ICD-10-CM

## 2023-10-04 DIAGNOSIS — R7301 Impaired fasting glucose: Secondary | ICD-10-CM

## 2023-10-04 DIAGNOSIS — I10 Essential (primary) hypertension: Secondary | ICD-10-CM | POA: Diagnosis not present

## 2023-10-04 DIAGNOSIS — Z0000 Encounter for general adult medical examination without abnormal findings: Secondary | ICD-10-CM | POA: Diagnosis not present

## 2023-10-04 DIAGNOSIS — R7303 Prediabetes: Secondary | ICD-10-CM | POA: Diagnosis not present

## 2023-10-04 DIAGNOSIS — Z7189 Other specified counseling: Secondary | ICD-10-CM | POA: Insufficient documentation

## 2023-10-04 DIAGNOSIS — M25552 Pain in left hip: Secondary | ICD-10-CM | POA: Insufficient documentation

## 2023-10-04 DIAGNOSIS — Z1389 Encounter for screening for other disorder: Secondary | ICD-10-CM | POA: Diagnosis not present

## 2023-10-04 DIAGNOSIS — R0989 Other specified symptoms and signs involving the circulatory and respiratory systems: Secondary | ICD-10-CM | POA: Insufficient documentation

## 2023-10-04 DIAGNOSIS — Z136 Encounter for screening for cardiovascular disorders: Secondary | ICD-10-CM | POA: Diagnosis not present

## 2023-10-04 LAB — URINALYSIS, ROUTINE W REFLEX MICROSCOPIC
Bilirubin, UA: NEGATIVE
Glucose, UA: NEGATIVE
Ketones, UA: NEGATIVE
Leukocytes,UA: NEGATIVE
Nitrite, UA: NEGATIVE
RBC, UA: NEGATIVE
Specific Gravity, UA: 1.026 (ref 1.005–1.030)
Urobilinogen, Ur: 0.2 mg/dL (ref 0.2–1.0)
pH, UA: 5.5 (ref 5.0–7.5)

## 2023-10-04 LAB — LIPID PANEL
Chol/HDL Ratio: 1.7 ratio (ref 0.0–4.4)
Cholesterol, Total: 160 mg/dL (ref 100–199)
HDL: 94 mg/dL (ref >39)
LDL Chol Calc (NIH): 57 mg/dL (ref 0–99)
Triglycerides: 41 mg/dL (ref 0–149)
VLDL Cholesterol Cal: 9 mg/dL (ref 5–40)

## 2023-10-04 MED ORDER — VITAMIN D 50 MCG (2000 UT) PO CAPS: 1.0000 | ORAL_CAPSULE | Freq: Every day | ORAL | 2 refills | Status: AC

## 2023-10-04 MED ORDER — ALBUTEROL SULFATE HFA 108 (90 BASE) MCG/ACT IN AERS: 2.0000 | INHALATION_SPRAY | Freq: Four times a day (QID) | RESPIRATORY_TRACT | 1 refills | Status: AC | PRN

## 2023-10-04 MED ORDER — GABAPENTIN 100 MG PO CAPS: 100.0000 mg | ORAL_CAPSULE | Freq: Every day | ORAL | 1 refills | Status: AC

## 2023-10-04 NOTE — Patient Instructions (Signed)
 A preventative services visit was completed today.  During the course of the visit today, we discussed and counseled about appropriate screening and preventive services.  A health risk assessment was established today that included a review of current medications, allergies, social history, family history, medical and preventative health history, biometrics, and preventative screenings to identify potential safety concerns or impairments.  A personalized plan was printed today for your records and use.   Personalized health advice and education was given today to reduce health risks and promote self management and wellness.  Information regarding end of life planning was discussed today.   Recommendations: Continue to return yearly for your annual wellness and preventative care visits.  This gives us  a chance to discuss healthy lifestyle, exercise, vaccinations, review your chart record, and perform screenings where appropriate.  I recommend you see your eye doctor yearly for routine vision care.  I recommend you see your dentist yearly for routine dental care including hygiene visits twice yearly.   Vaccination recommendations were reviewed Immunization History  Administered Date(s) Administered   PFIZER(Purple Top)SARS-COV-2 Vaccination 06/03/2019, 07/04/2019   Pfizer Covid-19 Vaccine Bivalent Booster 57yrs & up 01/15/2021   Pneumococcal Conjugate-13 03/08/2018    Recommendations: Flu, Shingrix, prevnar 20, tdap She declines vaccines today   Screening for cancer: Colon cancer screening: Reviewed 07/2021 colonoscopy, repeat 2028  Breast cancer screening: You should perform a self breast exam monthly.   We reviewed recommendations for regular mammograms and breast cancer screening.  Cervical cancer screening: We reviewed recommendations for pap smear screening.   Skin cancer screening: Check your skin regularly for new changes, growing lesions, or other lesions of concern Come  in for evaluation if you have skin lesions of concern.  Lung cancer screening: If you have a greater than 20 pack year history of tobacco use, then you may qualify for lung cancer screening with a chest CT scan.   Please call your insurance company to inquire about coverage for this test.  We currently don't have screenings for other cancers besides breast, cervical, colon, and lung cancers.  If you have a strong family history of cancer or have other cancer screening concerns, please let me know.    Bone health: Get at least 150 minutes of aerobic exercise weekly Get weight bearing exercise at least once weekly Bone density test:  A bone density test is an imaging test that uses a type of X-ray to measure the amount of calcium and other minerals in your bones. The test may be used to diagnose or screen you for a condition that causes weak or thin bones (osteoporosis), predict your risk for a broken bone (fracture), or determine how well your osteoporosis treatment is working. The bone density test is recommended for females 65 and older, or females or males <65 if certain risk factors such as thyroid disease, long term use of steroids such as for asthma or rheumatological issues, vitamin D  deficiency, estrogen deficiency, family history of osteoporosis, self or family history of fragility fracture in first degree relative.  2022 bone density test normal.  Consider repeat in 2027.   Heart health: Get at least 150 minutes of aerobic exercise weekly Limit alcohol It is important to maintain a healthy blood pressure and healthy cholesterol numbers  Heart disease screening: Screening for heart disease includes screening for blood pressure, fasting lipids, glucose/diabetes screening, BMI height to weight ratio, reviewed of smoking status, physical activity, and diet.    Goals include blood pressure 120/80 or less,  maintaining a healthy lipid/cholesterol profile, preventing diabetes or keeping  diabetes numbers under good control, not smoking or using tobacco products, exercising most days per week or at least 150 minutes per week of exercise, and eating healthy variety of fruits and vegetables, healthy oils, and avoiding unhealthy food choices like fried food, fast food, high sugar and high cholesterol foods.    Other tests may possibly include EKG test, CT coronary calcium score, echocardiogram, exercise treadmill stress test.   CT coronary calcium test 02/25/2023 with score of zero.      Medical care options: I recommend you continue to seek care here first for routine care.  We try really hard to have available appointments Monday through Friday daytime hours for sick visits, acute visits, and physicals.  Urgent care should be used for after hours and weekends for significant issues that cannot wait till the next day.  The emergency department should be used for significant potentially life-threatening emergencies.  The emergency department is expensive, can often have long wait times for less significant concerns, so try to utilize primary care, urgent care, or telemedicine when possible to avoid unnecessary trips to the emergency department.  Virtual visits and telemedicine have been introduced since the pandemic started in 2020, and can be convenient ways to receive medical care.  We offer virtual appointments as well to assist you in a variety of options to seek medical care.   Advanced Directives: I recommend you consider completing a Health Care Power of Attorney and Living Will.   These documents respect your wishes and help alleviate burdens on your loved ones if you were to become terminally ill or be in a position to need those documents enforced.    You can complete Advanced Directives yourself, have them notarized, then have copies made for our office, for you and for anybody you feel should have them in safe keeping.  Or, you can have an attorney prepare these documents.    If you haven't updated your Last Will and Testament in a while, it may be worthwhile having an attorney prepare these documents together and save on some costs.     Other signifnicant issues: High blood pressure-continue amlodipine  10 mg daily  Leg pain, leg numbness I am placing a referral to orthopedics today for further evaluation In the meantime begin gabapentin  100 mg at nighttime to help with sleep and pain I am also referring you for a blood flow screen of your legs   Back pain, leg pain, hip pain .  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 It's not high enough to be considered type 2 diabetes yet, but without making some lifestyle changes you are more likely to develop type 2 diabetes.  If you have prediabetes, the long-term damage of diabetes, especially to your heart, blood vessels and kidneys may already be starting. You may not be able to change certain risk factors such as age, race, or family history, but you CAN make changes to your lifestyle, your eating habits, and your activity.  Although diabetes can develop at any age, the risk of prediabetes increases after age 56.  Your risk of prediabetes increases if you have a parent or sibling with type 2 diabetes.   Although it's unclear why, certain people including  Black, Hispanic, American [place] and [place] American people, are more likely to develop prediabetes.  Ways to prevent or slow progression to diabetes: Eat healthy foods - Eating red meat and processed meat, and drinking sugar-sweetened beverages, is associated with a higher risk of prediabetes. A diet high in fruits, vegetables, nuts, whole grains and olive oil is associated with a lower risk of prediabetes. Get at least  150 minutes of moderate aerobic physical activity a week, or about 30 minutes on most days of the week.  The less active you are, the greater your risk of prediabetes. Physical activity helps you control your weight, uses up sugar for energy and makes the body use insulin more effectively. Lose excess weight - Being overweight is a primary risk factor for prediabetes. The more fatty tissue you have, especially inside and between the muscle and skin around your abdomen, the more resistant your cells become to insulin. Waist size. A large waist size can indicate insulin resistance. The risk of insulin resistance goes up for men with waists larger than 40 inches and for women with waists larger than 35 inches. Control your blood pressure and cholesterol.  If your blood pressure is not 130/80 or less, discuss with your provider to help get this under control.  It is ideal to have an HDL good cholesterol number >50 and have a LDL bad cholesterol number <100.   Don't smoke One simple strategy to help you make good food choices and eat appropriate portions sizes is to divide up your plate. These three divisions on your plate promote healthy eating:  One-half: fruit and nonstarchy vegetables One-quarter: whole grains One-quarter: protein-rich foods, such as legumes, fish or lean meats

## 2023-10-04 NOTE — Progress Notes (Signed)
 Subjective:    Rachel [name] is a 73 y.o. female who presents for Preventative Services visit and chronic medical problems/med check visit.    Primary Care Provider Sirron Francesconi, Alm RAMAN, PA-C here for primary care  Current Health Care Team: Dentist Eye doctor Dr. Elsp[facility] Dr. Charlie Croak, Physicians for [facility]   Medical Services you may have received from other than [facility] providers in the past year (date may be approximate) Dr. Croak  Exercise Current exercise habits: walking, resistant, yard work   Nutrition/Diet Current diet: not a great appetite  Depression Screen    10/04/2023   10:02 AM  Depression screen PHQ 2/9  Decreased Interest 0  Down, Depressed, Hopeless 0  PHQ - 2 Score 0    Activities of Daily Living Screen/Functional Status Survey Is the patient deaf or have difficulty hearing?: No Does the patient have difficulty seeing, even when wearing glasses/contacts?: No Does the patient have difficulty concentrating, remembering, or making decisions?: No Does the patient have difficulty walking or climbing stairs?: No Does the patient have difficulty dressing or bathing?: No Does the patient have difficulty doing errands alone such as visiting a doctor's office or shopping?: No  Can patient draw a clock face showing 3:15 oclock, yes  Fall Risk Screen    10/04/2023   10:01 AM 12/14/2022    3:37 PM 11/04/2021    9:58 AM 04/09/2021   10:40 AM 05/13/2020   10:31 AM  Fall Risk   Falls in the past year? 1 0 1 0 0  Number falls in past yr: 0 0 0 0 0  Injury with Fall? 0 0 1 0 0  Risk for fall due to : Impaired balance/gait No Fall Risks Impaired balance/gait No Fall Risks No Fall Risks  Follow up Falls evaluation completed Falls evaluation completed Falls evaluation completed  Falls evaluation completed  Falls evaluation completed      Data saved with a previous flowsheet row definition    Gait Assessment: Normal gait observed yes  Advanced  directives Does patient have a Health Care Power of Attorney? No Does patient have a Living Will? No  Past Medical History:  Diagnosis Date   Arthritis    knees   Bell's palsy    Cataract    removed both   Gastroesophageal reflux disease 02/17/2015   Herpes    Hypertension     Past Surgical History:  Procedure Laterality Date   ABDOMINAL HYSTERECTOMY     COLONOSCOPY  07/2021   POLYPECTOMY      Social History   Socioeconomic History   Marital status: Married    Spouse name: Not on file   Number of children: Not on file   Years of education: Not on file   Highest education level: Not on file  Occupational History   Not on file  Tobacco Use   Smoking status: Never   Smokeless tobacco: Never  Vaping Use   Vaping status: Never Used  Substance and Sexual Activity   Alcohol use: No   Drug use: No   Sexual activity: Not on file  Other Topics Concern   Not on file  Social History Narrative   Lives with husband.  No children.   Retired 2024.   Was working full time still, [employer], general practice, Location manager.  Husband works part time [employer], parking.  Exercise - walking, active, working.   09/2023   Social Drivers of Health   Financial Resource Strain: Low Risk  (  10/04/2023)   Overall Financial Resource Strain (CARDIA)    Difficulty of Paying Living Expenses: Not hard at all  Food Insecurity: No Food Insecurity (10/04/2023)   Hunger Vital Sign    Worried About Running Out of Food in the Last Year: Never true    Ran Out of Food in the Last Year: Never true  Transportation Needs: No Transportation Needs (10/04/2023)   PRAPARE - Administrator, Civil Service (Medical): No    Lack of Transportation (Non-Medical): No  Physical Activity: Sufficiently Active (10/04/2023)   Exercise Vital Sign    Days of Exercise per Week: 7 days    Minutes of Exercise per Session: 30 min  Stress: Stress Concern Present (10/04/2023)   [employer] of  Occupational Health - Occupational Stress Questionnaire    Feeling of Stress: Rather much  Social Connections: Socially Integrated (10/04/2023)   Social Connection and Isolation Panel    Frequency of Communication with Friends and Family: More than three times a week    Frequency of Social Gatherings with Friends and Family: Once a week    Attends Religious Services: 1 to 4 times per year    Active Member of [employer] or Organizations: Yes    Attends Banker Meetings: 1 to 4 times per year    Marital Status: Married  Catering manager Violence: Not At Risk (10/04/2023)   Humiliation, Afraid, Rape, and Kick questionnaire    Fear of Current or Ex-Partner: No    Emotionally Abused: No    Physically Abused: No    Sexually Abused: No    Family History  Problem Relation Age of Onset   Stroke Mother    Heart disease Mother    Prostate cancer Father    Heart disease Father    Cancer Father        prostate cancer   Diabetes Brother    Heart disease Brother    Colon cancer Maternal Grandmother        pt unsure, MGM might have had colon CA   Cancer Maternal Grandfather        ? type of cancer   Esophageal cancer Neg Hx    Stomach cancer Neg Hx    Rectal cancer Neg Hx    Colon polyps Neg Hx      Current Outpatient Medications:    acyclovir  (ZOVIRAX ) 400 MG tablet, TAKE 1 TABLET BY MOUTH TWICE A DAY, Disp: 180 tablet, Rfl: 5   amLODipine  (NORVASC ) 10 MG tablet, TAKE 1 TABLET BY MOUTH EVERY DAY, Disp: 90 tablet, Rfl: 1   aspirin EC 81 MG tablet, Take 81 mg by mouth daily. Swallow whole., Disp: , Rfl:    Cholecalciferol (VITAMIN D ) 50 MCG (2000 UT) CAPS, Take 1 capsule (2,000 Units total) by mouth daily., Disp: 90 capsule, Rfl: 0   Multiple Vitamin (MULTIVITAMIN) tablet, Take 1 tablet by mouth daily., Disp: , Rfl:    omeprazole  (PRILOSEC) 40 MG capsule, TAKE 1 CAPSULE (40 MG TOTAL) BY MOUTH DAILY., Disp: 90 capsule, Rfl: 1   zinc gluconate 50 MG tablet, Take 50 mg by mouth  daily., Disp: , Rfl:    albuterol  (VENTOLIN  HFA) 108 (90 Base) MCG/ACT inhaler, Inhale 2 puffs into the lungs every 6 (six) hours as needed for wheezing or shortness of breath. (Patient not taking: Reported on 10/04/2023), Disp: 18 g, Rfl: 0   Naproxen  375 MG TBEC, Take 1 tablet (375 mg total) by mouth 2 (two) times daily as needed. (  Patient not taking: Reported on 10/04/2023), Disp: 30 tablet, Rfl: 0  Allergies  Allergen Reactions   Covid-19 (Subunit) Vaccine     Sick right after vaccine    History reviewed: allergies, current medications, past family history, past medical history, past social history, past surgical history and problem list  Chronic issues discussed: Back pain, knees hurt, hips hurt.   Having trouble sleeping  Appetite reduced   Acute issues discussed: None  CHIZARA MENA is a 73 year old female who presents for a welcome to Medicare visit.  She experiences pain in her back, hip, and sometimes knees, with the back and hip being more severe. There is a sensation of coldness and numbness in her feet and toes, which she manages by using a water massage device. She reports that she was told by a clinician that an x-ray showed arthritis.  She has a history of prediabetes, with A1c levels indicating she remains in the prediabetic range. Her last cholesterol check was in October of the previous year, and she is not currently on any cholesterol medication.  She reports a lack of appetite and difficulty sleeping, often consuming fast food. She engages in physical activities such as walking in the morning, working in the yard, and painting.  She does not smoke or consume alcohol. She lives with her husband and is retired since 2024. No concerns about hearing or vision.    Objective:      Biometrics BP 120/80   Pulse 74   Ht 5' 0.5 (1.537 m)   Wt 152 lb (68.9 kg)   LMP 08/01/2012   SpO2 99%   BMI 29.20 kg/m   Wt Readings from Last 3 Encounters:  10/04/23  152 lb (68.9 kg)  09/01/23 149 lb 3.2 oz (67.7 kg)  08/04/23 145 lb 6.4 oz (66 kg)    Gen: wd, wn, nad HEENT: normocephalic, sclerae anicteric, TMs pearly, nares patent, no discharge or erythema, pharynx normal Oral cavity: MMM, no lesions Neck: supple, no lymphadenopathy, no thyromegaly, no masses, no bruits Heart: RRR, normal S1, S2, no murmurs Lungs: CTA bilaterally, no wheezes, rhonchi, or rales Abdomen: +bs, soft, non tender, non distended, no masses, no hepatomegaly, no splenomegaly Musculoskeletal: nontender, no swelling, no obvious deformity Back: nontneder, relative normal ROM Extremities: no edema, no cyanosis, no clubbing Pulses: 2+ symmetric, upper and 1+ bilat lower extremities, normal cap refill Neurological: alert, oriented x 3, CN2-12 intact, strength normal upper extremities and lower extremities, sensation normal throughout, DTRs 2+ throughout, no cerebellar signs, gait normal Psychiatric: normal affect, behavior normal, pleasant    Assessment:   Encounter Diagnoses  Name Primary?   Welcome to Medicare preventive visit Yes   Encounter for health maintenance examination in adult    Encounter for lipid screening for cardiovascular disease    Vaccine counseling    Impaired fasting blood sugar    Screening for hematuria or proteinuria    Essential hypertension    GERD without esophagitis    Prediabetes    Chronic low back pain, unspecified back pain laterality, unspecified whether sciatica present    Numbness and tingling of leg    Left hip pain    Advanced directives, counseling/discussion    Decreased pedal pulses      Plan:   A preventative services visit was completed today.  During the course of the visit today, we discussed and counseled about appropriate screening and preventive services.  A health risk assessment was established today that included a review  of current medications, allergies, social history, family history, medical and preventative  health history, biometrics, and preventative screenings to identify potential safety concerns or impairments.  A personalized plan was printed today for your records and use.   Personalized health advice and education was given today to reduce health risks and promote self management and wellness.  Information regarding end of life planning was discussed today.   Recommendations: Continue to return yearly for your annual wellness and preventative care visits.  This gives us  a chance to discuss healthy lifestyle, exercise, vaccinations, review your chart record, and perform screenings where appropriate.  I recommend you see your eye doctor yearly for routine vision care.  I recommend you see your dentist yearly for routine dental care including hygiene visits twice yearly.   Vaccination recommendations were reviewed Immunization History  Administered Date(s) Administered   PFIZER(Purple Top)SARS-COV-2 Vaccination 06/03/2019, 07/04/2019   Pfizer Covid-19 Vaccine Bivalent Booster 42yrs & up 01/15/2021   Pneumococcal Conjugate-13 03/08/2018    Recommendations: Flu, Shingrix, prevnar 20, tdap She declines vaccines today   Screening for cancer: Colon cancer screening: Reviewed 07/2021 colonoscopy, repeat 2028  Breast cancer screening: You should perform a self breast exam monthly.   We reviewed recommendations for regular mammograms and breast cancer screening.  Cervical cancer screening: We reviewed recommendations for pap smear screening.   Skin cancer screening: Check your skin regularly for new changes, growing lesions, or other lesions of concern Come in for evaluation if you have skin lesions of concern.  Lung cancer screening: If you have a greater than 20 pack year history of tobacco use, then you may qualify for lung cancer screening with a chest CT scan.   Please call your insurance company to inquire about coverage for this test.  We currently don't have screenings for  other cancers besides breast, cervical, colon, and lung cancers.  If you have a strong family history of cancer or have other cancer screening concerns, please let me know.    Bone health: Get at least 150 minutes of aerobic exercise weekly Get weight bearing exercise at least once weekly Bone density test:  A bone density test is an imaging test that uses a type of X-ray to measure the amount of calcium and other minerals in your bones. The test may be used to diagnose or screen you for a condition that causes weak or thin bones (osteoporosis), predict your risk for a broken bone (fracture), or determine how well your osteoporosis treatment is working. The bone density test is recommended for females 65 and older, or females or males <65 if certain risk factors such as thyroid disease, long term use of steroids such as for asthma or rheumatological issues, vitamin D  deficiency, estrogen deficiency, family history of osteoporosis, self or family history of fragility fracture in first degree relative.  2022 bone density test normal.  Consider repeat in 2027.   Heart health: Get at least 150 minutes of aerobic exercise weekly Limit alcohol It is important to maintain a healthy blood pressure and healthy cholesterol numbers  Heart disease screening: Screening for heart disease includes screening for blood pressure, fasting lipids, glucose/diabetes screening, BMI height to weight ratio, reviewed of smoking status, physical activity, and diet.    Goals include blood pressure 120/80 or less, maintaining a healthy lipid/cholesterol profile, preventing diabetes or keeping diabetes numbers under good control, not smoking or using tobacco products, exercising most days per week or at least 150 minutes per week of exercise, and eating  healthy variety of fruits and vegetables, healthy oils, and avoiding unhealthy food choices like fried food, fast food, high sugar and high cholesterol foods.    Other  tests may possibly include EKG test, CT coronary calcium score, echocardiogram, exercise treadmill stress test.   CT coronary calcium test 02/25/2023 with score of zero.      Medical care options: I recommend you continue to seek care here first for routine care.  We try really hard to have available appointments Monday through Friday daytime hours for sick visits, acute visits, and physicals.  Urgent care should be used for after hours and weekends for significant issues that cannot wait till the next day.  The emergency department should be used for significant potentially life-threatening emergencies.  The emergency department is expensive, can often have long wait times for less significant concerns, so try to utilize primary care, urgent care, or telemedicine when possible to avoid unnecessary trips to the emergency department.  Virtual visits and telemedicine have been introduced since the pandemic started in 2020, and can be convenient ways to receive medical care.  We offer virtual appointments as well to assist you in a variety of options to seek medical care.   Advanced Directives: I recommend you consider completing a Health Care Power of Attorney and Living Will.   These documents respect your wishes and help alleviate burdens on your loved ones if you were to become terminally ill or be in a position to need those documents enforced.    You can complete Advanced Directives yourself, have them notarized, then have copies made for our office, for you and for anybody you feel should have them in safe keeping.  Or, you can have an attorney prepare these documents.   If you haven't updated your Last Will and Testament in a while, it may be worthwhile having an attorney prepare these documents together and save on some costs.     Other signifnicant issues: High blood pressure-continue amlodipine  10 mg daily  Leg pain, leg numbness I am placing a referral to orthopedics today for further  evaluation In the meantime begin gabapentin  100 mg at nighttime to help with sleep and pain I am also referring you for a blood flow screen of your legs   Back pain, leg pain, hip pain .  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 It's not high enough to be considered type 2 diabetes yet, but without making some lifestyle changes you are more likely to develop type 2 diabetes.  If you have prediabetes, the long-term damage of diabetes, especially to your heart, blood vessels and kidneys may already be starting. You may not be able to change certain risk factors such as age, race, or family history, but you CAN make changes to your lifestyle, your eating habits, and your activity.  Although diabetes can develop at any age, the risk of prediabetes increases after age 59.  Your risk of prediabetes increases if you have a parent or sibling with type 2 diabetes.   Although it's unclear why, certain people including Black, Hispanic, American [place] and [place] American people, are more likely to develop prediabetes.  Ways to prevent or slow progression to diabetes: Eat healthy foods - Eating red meat and processed meat, and drinking sugar-sweetened  beverages, is associated with a higher risk of prediabetes. A diet high in fruits, vegetables, nuts, whole grains and olive oil is associated with a lower risk of prediabetes. Get at least 150 minutes of moderate aerobic physical activity a week, or about 30 minutes on most days of the week.  The less active you are, the greater your risk of prediabetes. Physical activity helps you control your weight, uses up sugar for energy and makes the body use insulin more effectively. Lose excess weight - Being overweight is  a primary risk factor for prediabetes. The more fatty tissue you have, especially inside and between the muscle and skin around your abdomen, the more resistant your cells become to insulin. Waist size. A large waist size can indicate insulin resistance. The risk of insulin resistance goes up for men with waists larger than 40 inches and for women with waists larger than 35 inches. Control your blood pressure and cholesterol.  If your blood pressure is not 130/80 or less, discuss with your provider to help get this under control.  It is ideal to have an HDL good cholesterol number >50 and have a LDL bad cholesterol number <100.   Don't smoke One simple strategy to help you make good food choices and eat appropriate portions sizes is to divide up your plate. These three divisions on your plate promote healthy eating:  One-half: fruit and nonstarchy vegetables One-quarter: whole grains One-quarter: protein-rich foods, such as legumes, fish or lean meats    Renia was seen today for [employer].  Diagnoses and all orders for this visit:  Welcome to Medicare preventive visit  Encounter for health maintenance examination in adult  Encounter for lipid screening for cardiovascular disease -     Lipid panel  Vaccine counseling  Impaired fasting blood sugar  Screening for hematuria or proteinuria -     Urinalysis, Routine w reflex microscopic  Essential hypertension -     VAS US  ABI WITH/WO TBI; Future  GERD without esophagitis  Prediabetes  Chronic low back pain, unspecified back pain laterality, unspecified whether sciatica present -     Ambulatory referral to Orthopedic Surgery  Numbness and tingling of leg -     Ambulatory referral to Orthopedic Surgery -     VAS US  ABI WITH/WO TBI; Future  Left hip pain -     Ambulatory referral to Orthopedic Surgery -     VAS US  ABI WITH/WO TBI; Future  Advanced directives, counseling/discussion  Decreased pedal pulses -     VAS  US  ABI WITH/WO TBI; Future    Medicare Attestation A preventative services visit was completed today.  During the course of the visit the patient was educated and counseled about appropriate screening and preventive services.  A health risk assessment was established with the patient that included a review of current medications, allergies, social history, family history, medical and preventative health history, biometrics, and preventative screenings to identify potential safety concerns or impairments.  A personalized plan was printed today for the patient's records and use.   Personalized health advice and education was given today to reduce health risks and promote self management and wellness.  Information regarding end of life planning was discussed today.  Ludie Gent, PA-C   10/04/2023

## 2023-10-05 ENCOUNTER — Other Ambulatory Visit: Payer: Self-pay | Admitting: Medical

## 2023-10-05 ENCOUNTER — Ambulatory Visit: Payer: Self-pay | Admitting: Medical

## 2023-10-05 NOTE — Progress Notes (Signed)
 Urine test negative.  Cholesterol looks good.  Expect phone calls about the blood flow study in the legs and referral to orthopedics  I am glad you are doing well otherwise!  Follow-up in a year for physical

## 2023-10-05 NOTE — Addendum Note (Signed)
 Addended by: VICCI HUSBAND A on: 10/05/2023 10:15 AM   Modules accepted: Orders

## 2023-10-06 ENCOUNTER — Ambulatory Visit: Payer: Self-pay | Admitting: Medical

## 2023-10-06 NOTE — Progress Notes (Signed)
 Abstract normal bone density test

## 2023-10-07 ENCOUNTER — Ambulatory Visit: Payer: Self-pay | Admitting: Medical

## 2023-11-09 ENCOUNTER — Ambulatory Visit: Admitting: Physical Medicine and Rehabilitation

## 2023-11-18 ENCOUNTER — Ambulatory Visit (HOSPITAL_COMMUNITY)
Admission: RE | Admit: 2023-11-18 | Discharge: 2023-11-18 | Disposition: A | Discharge status: Home / Self Care | Source: Ambulatory Visit | Attending: Medical | Admitting: Medical

## 2023-11-18 DIAGNOSIS — R202 Paresthesia of skin: Secondary | ICD-10-CM | POA: Diagnosis present

## 2023-11-18 DIAGNOSIS — I10 Essential (primary) hypertension: Secondary | ICD-10-CM | POA: Diagnosis present

## 2023-11-18 DIAGNOSIS — R200 Anesthesia of skin: Secondary | ICD-10-CM | POA: Diagnosis present

## 2023-11-18 DIAGNOSIS — M25552 Pain in left hip: Secondary | ICD-10-CM | POA: Insufficient documentation

## 2023-11-18 DIAGNOSIS — R0989 Other specified symptoms and signs involving the circulatory and respiratory systems: Secondary | ICD-10-CM | POA: Diagnosis present

## 2023-11-18 LAB — VAS US ABI WITH/WO TBI
Left ABI: 1.13
Right ABI: 1.16

## 2023-11-18 NOTE — Progress Notes (Signed)
 ABI screen is normal of the legs

## 2023-11-22 ENCOUNTER — Telehealth: Payer: Self-pay | Admitting: Family Medicine

## 2023-11-22 NOTE — Telephone Encounter (Signed)
 Left pt a message about her ABI results

## 2023-11-22 NOTE — Telephone Encounter (Signed)
 Rachel [name] said she was returning your call regarding the test she had at [facility].  I told her the results were in but Rachel [name] had not reviewed.  Please call her this afternoon, if she doesn't answer leave message on phone.

## 2023-11-24 ENCOUNTER — Telehealth: Payer: Self-pay | Admitting: Internal Medicine

## 2023-11-24 NOTE — Telephone Encounter (Signed)
 Spoke with patient. Its not mammogram results she needs. Something else. See other message   Copied from CRM [id number]. Topic: Clinical - Lab/Test Results >> Nov 22, 2023  9:47 AM Deaijah H wrote: Reason for CRM: Patient called in to return call regarding mammogram results >> Nov 24, 2023 10:29 AM Roselie BROCKS wrote:  Patient called in to return call regarding mammogram results, requests a call back as soon as possible. Can leave detailed message on voicemail.

## 2023-12-06 ENCOUNTER — Ambulatory Visit (INDEPENDENT_AMBULATORY_CARE_PROVIDER_SITE_OTHER): Admitting: Medical

## 2023-12-06 VITALS — BP 120/80 | HR 69 | Temp 97.9°F | Wt 151.8 lb

## 2023-12-06 DIAGNOSIS — I10 Essential (primary) hypertension: Secondary | ICD-10-CM | POA: Diagnosis not present

## 2023-12-06 DIAGNOSIS — R519 Headache, unspecified: Secondary | ICD-10-CM

## 2023-12-06 DIAGNOSIS — R202 Paresthesia of skin: Secondary | ICD-10-CM

## 2023-12-06 DIAGNOSIS — H5711 Ocular pain, right eye: Secondary | ICD-10-CM

## 2023-12-06 MED ORDER — VALACYCLOVIR HCL 1 G PO TABS: 1000.0000 mg | ORAL_TABLET | Freq: Three times a day (TID) | ORAL | 0 refills | Status: AC

## 2023-12-06 MED ORDER — HYDROCODONE-ACETAMINOPHEN 5-325 MG PO TABS: 1.0000 | ORAL_TABLET | Freq: Four times a day (QID) | ORAL | 0 refills | Status: AC | PRN

## 2023-12-06 NOTE — Addendum Note (Signed)
 Addended by: BULAH ALM RAMAN on: 12/06/2023 04:41 PM   Modules accepted: Orders

## 2023-12-06 NOTE — Progress Notes (Signed)
 Placed order in urgent referral
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
## 2023-12-08 ENCOUNTER — Telehealth: Payer: Self-pay | Admitting: Medical

## 2023-12-08 NOTE — Telephone Encounter (Signed)
 Call and see how the eye doctor appointment went?  What did they find or what diagnosis?

## 2023-12-08 NOTE — Telephone Encounter (Signed)
 Pt states the eye doctor didn't find anything but recommended that she continue taking the medication for possible shingles. Pt has not had anymore pain since taking the medication and no rash has come up.

## 2023-12-08 NOTE — Telephone Encounter (Signed)
 Left message for pt to call me back

## 2024-02-02 ENCOUNTER — Other Ambulatory Visit: Payer: Self-pay | Admitting: Medical

## 2024-10-09 ENCOUNTER — Encounter: Payer: Self-pay | Admitting: Medical
